# Patient Record
Sex: Female | Born: 1965 | Race: Black or African American | Hispanic: No | Marital: Married | Smoking: Never smoker
Health system: Southern US, Community
[De-identification: ages and names within clinical notes are randomized; demographics above are authoritative.]

## PROBLEM LIST (undated history)

## (undated) DIAGNOSIS — Z789 Other specified health status: Secondary | ICD-10-CM

## (undated) DIAGNOSIS — M1990 Unspecified osteoarthritis, unspecified site: Secondary | ICD-10-CM

## (undated) HISTORY — PX: SHOULDER ARTHROSCOPY: SHX128

## (undated) HISTORY — PX: DILATION AND CURETTAGE OF UTERUS: SHX78

---

## 2008-12-27 ENCOUNTER — Inpatient Hospital Stay (HOSPITAL_COMMUNITY): Admission: RE | Admit: 2008-12-27 | Discharge: 2008-12-28 | Payer: Self-pay | Admitting: Orthopedic Surgery

## 2010-01-21 IMAGING — CR DG CHEST 2V
2 series · 2 of 2 positions shown · non-contrast
Comparison: None available.

CLINICAL DATA: Preadmission respiratory film in patient for right
shoulder surgery.

CHEST - 2 VIEW

[view not recorded (1 of 2)]
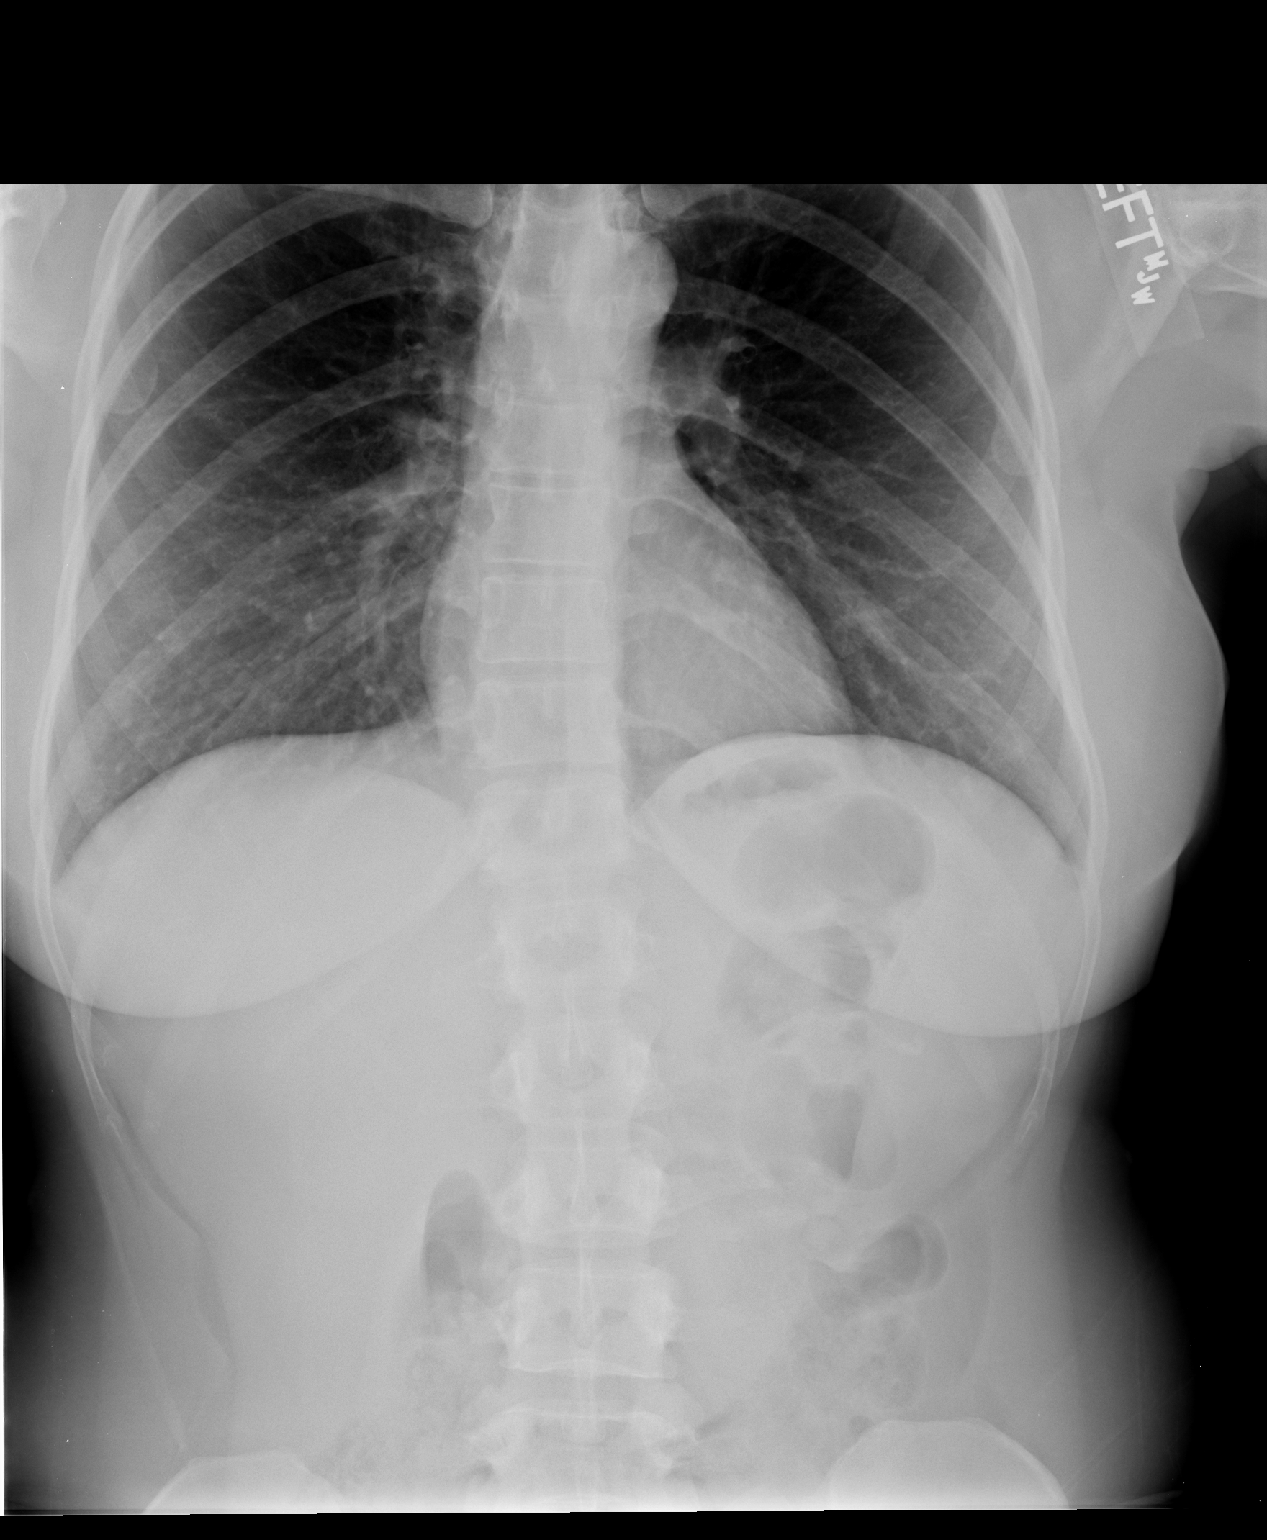

[view not recorded (2 of 2)]
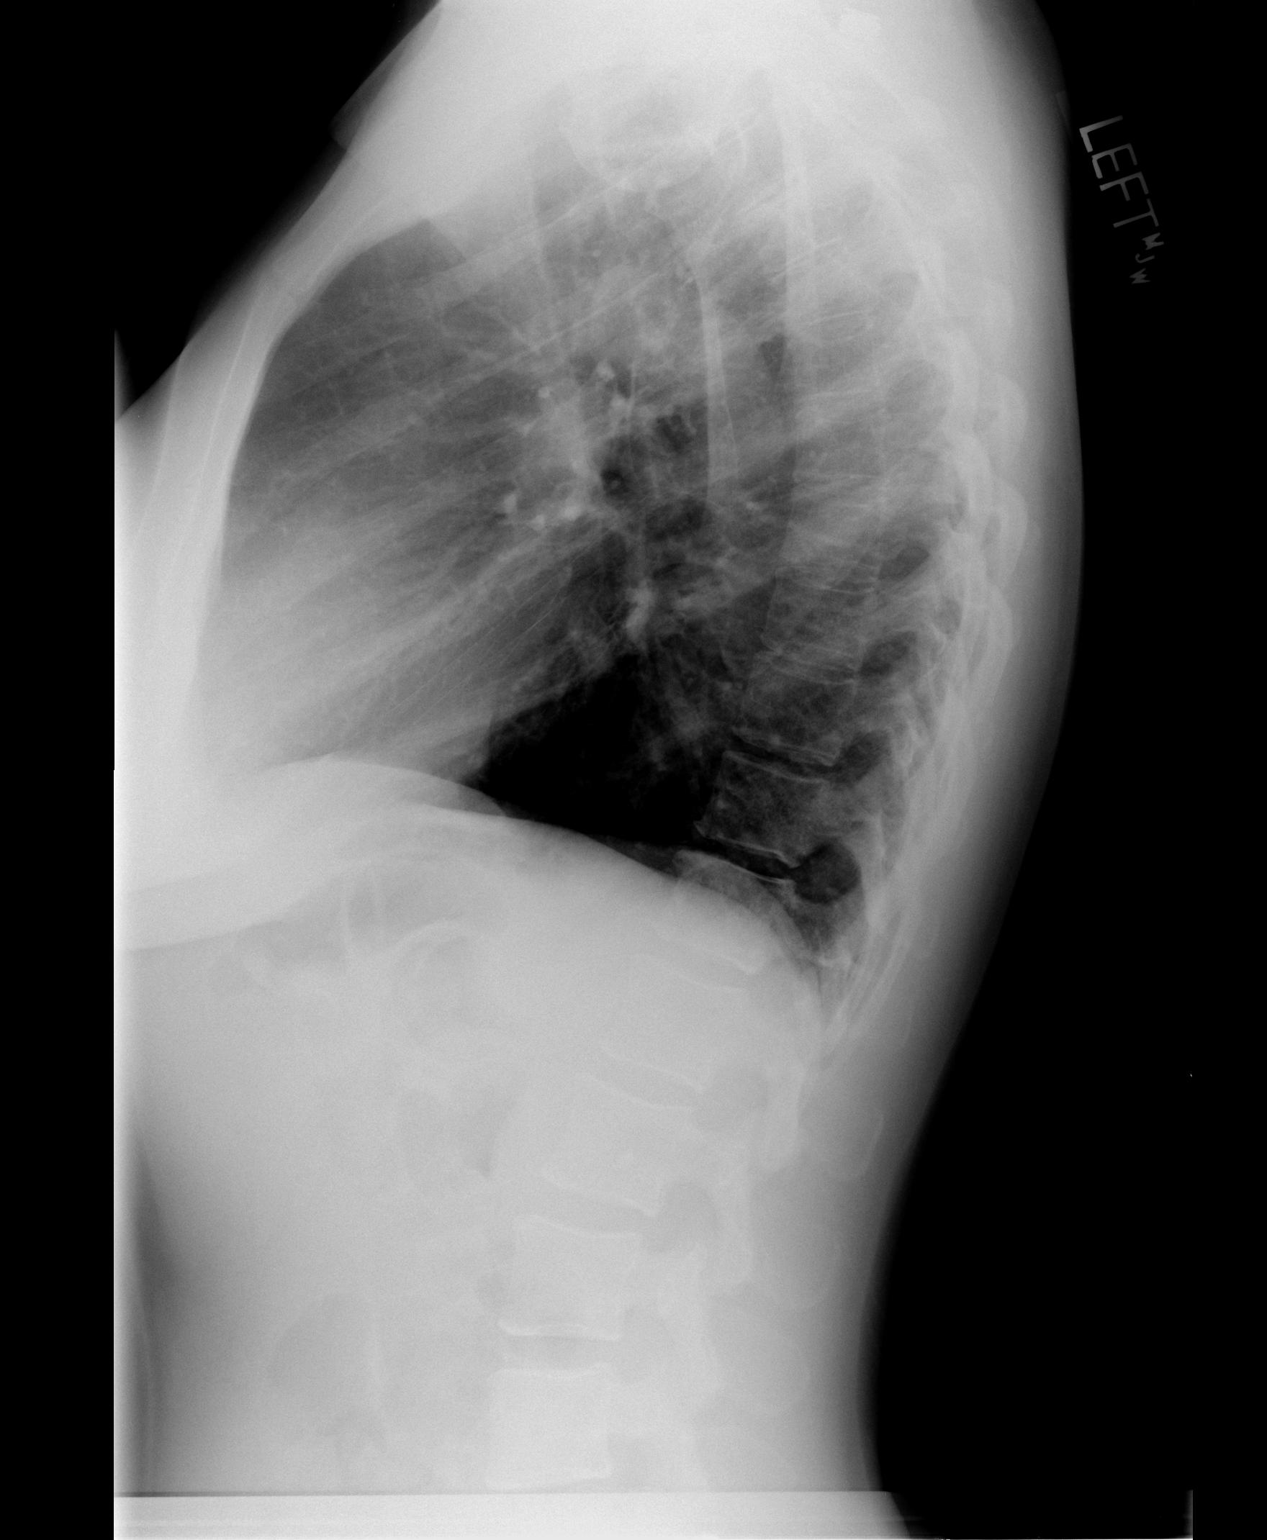

[2 of 2 positions shown; findings below may reference images not displayed]

FINDINGS: Lungs are clear.  No pleural effusion.  Heart size
normal.  No focal bony abnormality.
IMPRESSION: No acute disease.

## 2011-03-26 LAB — BASIC METABOLIC PANEL
BUN: 7 mg/dL (ref 6–23)
CO2: 23 mEq/L (ref 19–32)
Calcium: 9.4 mg/dL (ref 8.4–10.5)
Creatinine, Ser: 0.63 mg/dL (ref 0.4–1.2)
GFR calc Af Amer: >60 mL/min (ref >60)
GFR calc non Af Amer: >60 mL/min (ref >60)
Glucose, Bld: 87 mg/dL (ref 70–99)
Glucose, Bld: 112 mg/dL — ABNORMAL HIGH (ref 70–99)
Potassium: 3.2 mEq/L — ABNORMAL LOW (ref 3.5–5.1)
Sodium: 139 mEq/L (ref 135–145)

## 2011-03-26 LAB — APTT: aPTT: 27 seconds (ref 24–37)

## 2011-03-26 LAB — CBC
HCT: 30.4 % — ABNORMAL LOW (ref 36.0–46.0)
Hemoglobin: 10.5 g/dL — ABNORMAL LOW (ref 12.0–15.0)
MCHC: 33.4 g/dL (ref 30.0–36.0)
RBC: 3.33 MIL/uL — ABNORMAL LOW (ref 3.87–5.11)
RDW: 13.6 % (ref 11.5–15.5)
RDW: 14.1 % (ref 11.5–15.5)
WBC: 14.9 10*3/uL — ABNORMAL HIGH (ref 4.0–10.5)

## 2011-03-26 LAB — URINALYSIS, ROUTINE W REFLEX MICROSCOPIC
Glucose, UA: NEGATIVE mg/dL
Ketones, ur: NEGATIVE mg/dL
Specific Gravity, Urine: 1.014 (ref 1.005–1.030)
pH: 7 (ref 5.0–8.0)

## 2011-03-26 LAB — DIFFERENTIAL
Basophils Absolute: 0.1 10*3/uL (ref 0.0–0.1)
Basophils Relative: 1 % (ref 0–1)
Monocytes Absolute: 0.6 10*3/uL (ref 0.1–1.0)
Neutro Abs: 6 10*3/uL (ref 1.7–7.7)
Neutrophils Relative %: 70 % (ref 43–77)

## 2011-03-26 LAB — PROTIME-INR
INR: 1.1 (ref 0.00–1.49)
INR: 1.1 (ref 0.00–1.49)

## 2011-04-24 NOTE — Op Note (Signed)
NAMEKENISHA, LYNDS                 ACCOUNT NO.:  [id number]   MEDICAL RECORD NO.:  [id number]          PATIENT TYPE:  INP   LOCATION:  5041                         FACILITY:  [facility]   PHYSICIAN:  Feliberto Gottron. Turner Daniels, M.D.   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   PROCEDURE:  Right shoulder hemiarthroplasty using DePuy total shoulder  components an #8 body and a 48, +18 head.   SURGEON:  Feliberto Gottron. Turner Daniels, MD   FIRST ASSISTANT:  Shirl Harris, PA-C   ANESTHESIA:  Interscalene block plus endotracheal.   ESTIMATED BLOOD LOSS:  300 mL.   FLUID REPLACEMENT:  1500 mL of crystalloid.   DRAINS PLACED:  Foley catheter.   URINE OUTPUT:  300 mL.   INDICATIONS FOR PROCEDURE:  This is a 45 year old woman who I have  followed for last couple of years with avascular necrosis of the right  humeral head with collapse on the first x-ray when I met her.  She has  failed conservative treatment with anti-inflammatory medicines,  judicious use of narcotics, attempts at some physical therapy, but she  has a structural lesion of the humeral head with collapse and because of  the severe disabling pain desires elective hemiarthroplasty.  Risks and  benefits of surgery discussed, questions answered.   DESCRIPTION OF PROCEDURE:  The patient identified by armband and  underwent right interscalene block anesthetic at [facility] [address] in  the block area.  She received preoperative IV antibiotics and was taken  to operating room #15 where the appropriate anesthetic monitors were  attached and general endotracheal anesthesia induced with the patient in  supine position.  She was then  placed in a 30-degree beach chair  position and the right upper extremity prepped and draped in the usual  sterile fashion from the wrist to the hemithorax.  A standard time-out  procedure was then performed.  The skin along the deltopectoral interval  was then infiltrated with 20 mL of 0.5% Marcaine and epinephrine  solution and we began the actual procedure by making an incision  starting at the level of clavicle just above the coracoid process and  then following the deltopectoral interval for a distance of about 14 cm.  Small bleeders in the skin and subcutaneous tissue identified and  cauterized.  We identified the cephalic vein and retracted it laterally  with the deltoid muscle and exploited the deltopectoral fascia.  We  identified the coracoid process, identified the long head of the biceps,  and a shoulder retractor was placed between the long head of the biceps  in the deltoid exposing the insertion of the subscapularis muscle as  well as the greater tuberosity.  Using electrocautery, we then peeled  the subscapularis muscle off the lesser tuberosity and tagged with  two  #2 FiberWire sutures using a Mason-Allen stitch and exposed the humeral  head.  We then entered the shaft of the humerus about 5 or 6 mm medial  to the biceps groove with a small drill and then reamed up to a #8  reamer to the appropriate depth with good fit and fill.  The humeral  head cutting guide was then placed over the reamer with a standard 30  degrees of retroversion, and we resected the humeral head which had a  large divot in it from the AVN and measured for a 44 x 18 humeral head.  We then set about removing osteophytes from around the humeral head and  used a Crego retractor and a small Hohmann retractor to enhance our  exposure.  The body cutting osteotome was then applied and a trial #6  stem was inserted, and we performed trials with a 15 and the 18 x 48.  The 18 x 48 had the best fit and fill.   The shoulder would not go out,  and internal rotation and external rotation of about 45-60 degrees was  required before the implant was perched at all.  At this point the trial  components removed.  We evaluated the glenoid which was in good  condition, and we then inserted an #8 body which hammered into place  nicely and used bone graft from the osteophyte resection to supplement  the fixation.  We then hammered into place an 18 x 48 head after first  placing our sutures through the metaphysis inferiorly and anteriorly of  the humeral shaft for fixation of the subscapularis muscle which was  then sewed back down on its insertion using the sutures passed through  the bone #2 FiberWire.  The rotator interval was also closed with #2  FiberWire.  The shoulder was then irrigated out with normal saline  solution, taken through a range of motion, confirming good firm  fixation, and then the subcutaneous tissue closed with running 2-0  Vicryl and the skin with running interlocking 3-0 nylon.  A dressing of  Xeroform, 4 x 4s, ABD, and a simple sling was then applied.  The patient  was laid supine, awakened, and taken to the recovery room without  difficulty.      Feliberto Gottron. Turner Daniels, M.D.  Electronically Signed     FJR/MEDQ  D:  12/27/2008  T:  12/27/2008  Job:  [id number]

## 2012-01-10 ENCOUNTER — Ambulatory Visit: Payer: 59

## 2012-01-10 ENCOUNTER — Ambulatory Visit (INDEPENDENT_AMBULATORY_CARE_PROVIDER_SITE_OTHER): Payer: 59 | Admitting: Family Medicine

## 2012-01-10 VITALS — BP 122/80 | HR 72 | Temp 98.3°F | Resp 18 | Ht 65.5 in | Wt 153.0 lb

## 2012-01-10 DIAGNOSIS — J40 Bronchitis, not specified as acute or chronic: Secondary | ICD-10-CM

## 2012-01-10 DIAGNOSIS — R05 Cough: Secondary | ICD-10-CM

## 2012-01-10 DIAGNOSIS — M79646 Pain in unspecified finger(s): Secondary | ICD-10-CM

## 2012-01-10 DIAGNOSIS — M79609 Pain in unspecified limb: Secondary | ICD-10-CM

## 2012-01-10 DIAGNOSIS — R059 Cough, unspecified: Secondary | ICD-10-CM

## 2012-01-10 MED ORDER — AZITHROMYCIN 250 MG PO TABS: ORAL_TABLET | ORAL | Status: AC

## 2012-01-10 MED ORDER — PREDNISONE 20 MG PO TABS: 40.0000 mg | ORAL_TABLET | Freq: Every day | ORAL | Status: AC

## 2012-01-10 MED ORDER — HYDROCODONE-HOMATROPINE 5-1.5 MG/5ML PO SYRP: 5.0000 mL | ORAL_SOLUTION | Freq: Three times a day (TID) | ORAL | Status: AC | PRN

## 2012-01-10 NOTE — Progress Notes (Signed)
  Subjective:    Patient ID: Evelyn Juarez, female    DOB: 1966/11/26, 46 y.o.   MRN: [id number]  HPI 46 yo female with 2 complaints:  Cough:  Comes and goes, hoarse as well.  Dry cough.  Scratchy throat.  5 days.  No sinus symptoms.  EArs full.  No fever.  Received flu shot.  Non smoker.  NO history of asthma.  Tried alkaselter, helped some.  Cough is worse at night.   Hand pain:  Smashed 3rd finger on right in car door.  Tip of finer.  Swollen.  TTP.     Review of Systems Negative except as per hpi    Objective:   Physical Exam  Constitutional: She appears well-developed. No distress.  HENT:  Right Ear: Tympanic membrane, external ear and ear canal normal. Tympanic membrane is not injected, not scarred, not perforated, not erythematous, not retracted and not bulging.  Left Ear: Tympanic membrane, external ear and ear canal normal. Tympanic membrane is not injected, not scarred, not perforated, not erythematous, not retracted and not bulging.  Nose: No mucosal edema or rhinorrhea. Right sinus exhibits no maxillary sinus tenderness and no frontal sinus tenderness. Left sinus exhibits no maxillary sinus tenderness and no frontal sinus tenderness.  Mouth/Throat: Uvula is midline, oropharynx is clear and moist and mucous membranes are normal. No oropharyngeal exudate or tonsillar abscesses.  Cardiovascular: Normal rate, regular rhythm, normal heart sounds and intact distal pulses.   No murmur heard. Pulmonary/Chest: Effort normal and breath sounds normal. No respiratory distress. She has no wheezes. She has no rales.  Lymphadenopathy:       Head (right side): No submandibular and no preauricular adenopathy present.       Head (left side): No submandibular and no preauricular adenopathy present.       Right cervical: No superficial cervical and no posterior cervical adenopathy present.      Left cervical: No superficial cervical and no posterior cervical adenopathy present.       Right: No  supraclavicular adenopathy present.       Left: No supraclavicular adenopathy present.  Skin: Skin is warm and dry.  Right middle finger TTP DIP.  Decreased flexion.  Swollen.  Hematoma just proximal to nail bed, not under nail.    [facility] reading (PRIMARY) by  Dr. Georgiana Shore:  No obvious fracture of 3rd distal phalanx or DIP.      Assessment & Plan:

## 2012-01-10 NOTE — Patient Instructions (Signed)
Thank you for coming in today.  I appreciate your patience as we become more comfortable with our computer system.  Today you saw Ardeen Garland, MD. I hope you feel better quickly. Please review the information below regarding your diagnosis(es) at your leisure.      Bronchitis Bronchitis is the body's way of reacting to injury and/or infection (inflammation) of the bronchi. Bronchi are the air tubes that extend from the windpipe into the lungs. If the inflammation becomes severe, it may cause shortness of breath. CAUSES  Inflammation may be caused by:  A virus.   Germs (bacteria).   Dust.   Allergens.   Pollutants and many other irritants.  The cells lining the bronchial tree are covered with tiny hairs (cilia). These constantly beat upward, away from the lungs, toward the mouth. This keeps the lungs free of pollutants. When these cells become too irritated and are unable to do their job, mucus begins to develop. This causes the characteristic cough of bronchitis. The cough clears the lungs when the cilia are unable to do their job. Without either of these protective mechanisms, the mucus would settle in the lungs. Then you would develop pneumonia. Smoking is a common cause of bronchitis and can contribute to pneumonia. Stopping this habit is the single most important thing you can do to help yourself. TREATMENT   Your caregiver may prescribe an antibiotic if the cough is caused by bacteria. Also, medicines that open up your airways make it easier to breathe. Your caregiver may also recommend or prescribe an expectorant. It will loosen the mucus to be coughed up. Only take over-the-counter or prescription medicines for pain, discomfort, or fever as directed by your caregiver.   Removing whatever causes the problem (smoking, for example) is critical to preventing the problem from getting worse.   Cough suppressants may be prescribed for relief of cough symptoms.   Inhaled medicines may  be prescribed to help with symptoms now and to help prevent problems from returning.   For those with recurrent (chronic) bronchitis, there may be a need for steroid medicines.  SEEK IMMEDIATE MEDICAL CARE IF:   During treatment, you develop more pus-like mucus (purulent sputum).   You have a fever.   Your baby is older than 3 months with a rectal temperature of 102 F (38.9 C) or higher.   Your baby is 22 months old or younger with a rectal temperature of 100.4 F (38 C) or higher.   You become progressively more ill.   You have increased difficulty breathing, wheezing, or shortness of breath.  It is necessary to seek immediate medical care if you are elderly or sick from any other disease. MAKE SURE YOU:   Understand these instructions.   Will watch your condition.   Will get help right away if you are not doing well or get worse.  Document Released: 11/26/2005 Document Revised: 08/08/2011 Document Reviewed: 10/05/2008 [facility] Patient Information 2012 [place], [place].

## 2013-12-07 ENCOUNTER — Ambulatory Visit (INDEPENDENT_AMBULATORY_CARE_PROVIDER_SITE_OTHER): Payer: 59 | Admitting: Internal Medicine

## 2013-12-07 VITALS — BP 110/72 | HR 82 | Temp 98.5°F | Resp 18 | Ht 65.5 in | Wt 148.0 lb

## 2013-12-07 DIAGNOSIS — R05 Cough: Secondary | ICD-10-CM

## 2013-12-07 DIAGNOSIS — J019 Acute sinusitis, unspecified: Secondary | ICD-10-CM

## 2013-12-07 DIAGNOSIS — J029 Acute pharyngitis, unspecified: Secondary | ICD-10-CM

## 2013-12-07 MED ORDER — HYDROCODONE-HOMATROPINE 5-1.5 MG/5ML PO SYRP: 5.0000 mL | ORAL_SOLUTION | Freq: Four times a day (QID) | ORAL | Status: DC | PRN

## 2013-12-07 MED ORDER — AMOXICILLIN 875 MG PO TABS: 875.0000 mg | ORAL_TABLET | Freq: Two times a day (BID) | ORAL | Status: DC

## 2013-12-07 NOTE — Progress Notes (Signed)
  This chart was scribed for Ellamae Sia, MD by Joaquin Music, ED Scribe. This patient was seen in room Room/bed 13 and the patient's care was started at 1:23 PM. Subjective:    Patient ID: Evelyn Juarez, female    DOB: Jan 03, 1966, 47 y.o.   MRN: [id number] Chief Complaint  Patient presents with  . Nasal Congestion    x3 days   . Cough  . Headache  . Otalgia   HPI Evelyn Juarez is a 47 y.o. female who presents to the [facility] complaining of ongoing cough, nasal congestion, and HA that began 5 days ago. Pt states her cough has been productive with green/yellow sputum. She states when her symptoms initially started, she was having a sore throat but denies having a sore throat at this moment. Pt denies fever and diaphoresis.   History   Social History  . Marital Status: Married    Spouse Name: N/A    Number of Children: N/A  . Years of Education: N/A   Social History Main Topics  . Smoking status: Never Smoker   . Smokeless tobacco: None  . Alcohol Use: None  . Drug Use: None  . Sexual Activity: None   Other Topics Concern  . None   Social History Narrative  . None   History reviewed. No pertinent past surgical history.  Family History  Problem Relation Age of Onset  . Diabetes Mother    No current outpatient prescriptions on file.   Review of Systems A complete 10 system review of systems was obtained and all systems are negative except as noted in the HPI and PMH.   Objective:   Physical Exam  Constitutional: She appears well-developed and well-nourished. No distress.  HENT:  Mouth/Throat: Oropharynx is clear and moist.  Purulent discharge from both nares.  Eyes: Conjunctivae and EOM are normal. Pupils are equal, round, and reactive to light.  Pulmonary/Chest: Effort normal and breath sounds normal. She has no wheezes.  Lymphadenopathy:    She has no cervical adenopathy.  Psychiatric: She has a normal mood and affect. Her behavior is normal. Thought  content normal.    BP 110/72  Pulse 82  Temp(Src) 98.5 F (36.9 C) (Oral)  Resp 18  Ht 5' 5.5" (1.664 m)  Wt 148 lb (67.132 kg)  BMI 24.25 kg/m2  SpO2 100%  LMP 11/28/2013 Assessment & Plan:   1. Acute sinusitis, unspecified   2. Acute pharyngitis   3. Cough    Meds ordered this encounter  Medications  . HYDROcodone-homatropine (HYCODAN) 5-1.5 MG/5ML syrup    Sig: Take 5 mLs by mouth every 6 (six) hours as needed for cough.    Dispense:  120 mL    Refill:  0  . amoxicillin (AMOXIL) 875 MG tablet    Sig: Take 1 tablet (875 mg total) by mouth 2 (two) times daily.    Dispense:  20 tablet    Refill:  0     I personally performed the services described in this documentation, which was scribed in my presence. The recorded information has been reviewed and is accurate.

## 2014-08-12 ENCOUNTER — Encounter (HOSPITAL_COMMUNITY): Payer: Self-pay | Admitting: *Deleted

## 2014-08-13 ENCOUNTER — Encounter (HOSPITAL_COMMUNITY): Payer: Self-pay | Admitting: Pharmacist

## 2014-08-26 NOTE — H&P (Signed)
Evelyn Juarez is an 48 y.o. female with heavy menses. U/S in office C/W fibroids and 12 mm IU mass.  Pertinent Gynecological History: Menses: flow is excessive with use of many pads or tampons on heaviest days Bleeding: dysfunctional uterine bleeding Contraception: tubal ligation DES exposure: denies Blood transfusions: none Sexually transmitted diseases: no past history Previous GYN Procedures: none  Last mammogram: normal Date: 2015 Last pap: normal Date: 2015 OB History: G3, P1   Menstrual History: Menarche age: unknown  Patient's last menstrual period was 08/08/2014.    History reviewed. No pertinent past medical history.  Past Surgical History  Procedure Laterality Date  . Shoulder arthroscopy      Family History  Problem Relation Age of Onset  . Diabetes Mother     Social History:  reports that she has never smoked. She does not have any smokeless tobacco history on file. She reports that she drinks alcohol. She reports that she does not use illicit drugs.  Allergies: No Known Allergies  No prescriptions prior to admission    Review of Systems  Constitutional: Negative for fever.    Last menstrual period 08/08/2014. Physical Exam  Cardiovascular: Normal rate and regular rhythm.   Respiratory: Effort normal and breath sounds normal.  GI: There is no tenderness.    No results found for this or any previous visit (from the past 24 hour(s)).  No results found.  Assessment/Plan: 48 yo with menorrhagia and IU mass D/W patient H/S, D&C, Trueclear resectoscope D/W risks including infection, organ damage, bleeding/transfusion-HIV/Hep, uterine perforation, DVT/PE, pneumonia, laparotomy, L/S, return to OR, persistent or recurrent abnormal bleeding  Jadence Kinlaw II,Dravin Lance E 08/26/2014, 6:50 PM

## 2014-08-27 ENCOUNTER — Encounter (HOSPITAL_COMMUNITY)
Admission: RE | Disposition: A | Discharge status: Home / Self Care | Payer: Self-pay | Source: Ambulatory Visit | Attending: Obstetrics and Gynecology

## 2014-08-27 ENCOUNTER — Ambulatory Visit (HOSPITAL_COMMUNITY): Payer: 59 | Admitting: Anesthesiology

## 2014-08-27 ENCOUNTER — Encounter (HOSPITAL_COMMUNITY): Payer: Self-pay | Admitting: *Deleted

## 2014-08-27 ENCOUNTER — Encounter (HOSPITAL_COMMUNITY): Payer: 59 | Admitting: Anesthesiology

## 2014-08-27 ENCOUNTER — Ambulatory Visit (HOSPITAL_COMMUNITY)
Admission: RE | Admit: 2014-08-27 | Discharge: 2014-08-27 | Disposition: A | Discharge status: Home / Self Care | Payer: 59 | Source: Ambulatory Visit | Attending: Obstetrics and Gynecology | Admitting: Obstetrics and Gynecology

## 2014-08-27 DIAGNOSIS — N920 Excessive and frequent menstruation with regular cycle: Secondary | ICD-10-CM | POA: Insufficient documentation

## 2014-08-27 DIAGNOSIS — N841 Polyp of cervix uteri: Secondary | ICD-10-CM | POA: Diagnosis not present

## 2014-08-27 HISTORY — DX: Other specified health status: Z78.9

## 2014-08-27 HISTORY — PX: DILATATION & CURETTAGE/HYSTEROSCOPY WITH TRUECLEAR: SHX6353

## 2014-08-27 LAB — CBC
HCT: 31.8 % — ABNORMAL LOW (ref 36.0–46.0)
Hemoglobin: 11.7 g/dL — ABNORMAL LOW (ref 12.0–15.0)
MCH: 31 pg (ref 26.0–34.0)
MCHC: 36.8 g/dL — AB (ref 30.0–36.0)
MCV: 84.1 fL (ref 78.0–100.0)
Platelets: 242 10*3/uL (ref 150–400)
RBC: 3.78 MIL/uL — ABNORMAL LOW (ref 3.87–5.11)
RDW: 15.4 % (ref 11.5–15.5)
WBC: 8.7 10*3/uL (ref 4.0–10.5)

## 2014-08-27 SURGERY — DILATATION & CURETTAGE/HYSTEROSCOPY WITH TRUCLEAR
Anesthesia: General | Site: Vagina

## 2014-08-27 MED ORDER — LIDOCAINE HCL 1 % IJ SOLN
INTRAMUSCULAR | Status: DC | PRN
  Administered 2014-08-27: 20 mL

## 2014-08-27 MED ORDER — MIDAZOLAM HCL 2 MG/2ML IJ SOLN
INTRAMUSCULAR | Status: DC | PRN
  Administered 2014-08-27: 2 mg via INTRAVENOUS

## 2014-08-27 MED ORDER — FENTANYL CITRATE 0.05 MG/ML IJ SOLN
INTRAMUSCULAR | Status: AC
  Filled 2014-08-27: qty 5

## 2014-08-27 MED ORDER — ONDANSETRON HCL 4 MG/2ML IJ SOLN
INTRAMUSCULAR | Status: DC | PRN
  Administered 2014-08-27: 4 mg via INTRAVENOUS

## 2014-08-27 MED ORDER — PROPOFOL INFUSION 10 MG/ML OPTIME
INTRAVENOUS | Status: DC | PRN
Start: 2014-08-27 — End: 2014-08-27
  Administered 2014-08-27: 20 mL via INTRAVENOUS

## 2014-08-27 MED ORDER — FENTANYL CITRATE 0.05 MG/ML IJ SOLN
INTRAMUSCULAR | Status: AC
  Administered 2014-08-27: 25 ug via INTRAVENOUS
  Filled 2014-08-27: qty 2

## 2014-08-27 MED ORDER — CEFAZOLIN SODIUM-DEXTROSE 2-3 GM-% IV SOLR: 2.0000 g | INTRAVENOUS | Status: DC

## 2014-08-27 MED ORDER — FENTANYL CITRATE 0.05 MG/ML IJ SOLN
INTRAMUSCULAR | Status: DC | PRN
  Administered 2014-08-27: 100 ug via INTRAVENOUS
  Administered 2014-08-27: 50 ug via INTRAVENOUS

## 2014-08-27 MED ORDER — ONDANSETRON HCL 4 MG/2ML IJ SOLN
INTRAMUSCULAR | Status: AC
  Filled 2014-08-27: qty 2

## 2014-08-27 MED ORDER — LACTATED RINGERS IV SOLN
INTRAVENOUS | Status: DC
  Administered 2014-08-27 (×2): via INTRAVENOUS

## 2014-08-27 MED ORDER — SCOPOLAMINE 1 MG/3DAYS TD PT72
1.0000 | MEDICATED_PATCH | Freq: Once | TRANSDERMAL | Status: DC
  Administered 2014-08-27: 1.5 mg via TRANSDERMAL

## 2014-08-27 MED ORDER — DEXAMETHASONE SODIUM PHOSPHATE 10 MG/ML IJ SOLN
INTRAMUSCULAR | Status: AC
  Filled 2014-08-27: qty 1

## 2014-08-27 MED ORDER — LIDOCAINE HCL (CARDIAC) 20 MG/ML IV SOLN
INTRAVENOUS | Status: AC
  Filled 2014-08-27: qty 5

## 2014-08-27 MED ORDER — LIDOCAINE HCL (CARDIAC) 20 MG/ML IV SOLN
INTRAVENOUS | Status: DC | PRN
  Administered 2014-08-27: 60 mg via INTRAVENOUS

## 2014-08-27 MED ORDER — FENTANYL CITRATE 0.05 MG/ML IJ SOLN
25.0000 ug | INTRAMUSCULAR | Status: DC | PRN
  Administered 2014-08-27: 50 ug via INTRAVENOUS
  Administered 2014-08-27: 25 ug via INTRAVENOUS

## 2014-08-27 MED ORDER — SCOPOLAMINE 1 MG/3DAYS TD PT72
MEDICATED_PATCH | TRANSDERMAL | Status: AC
  Administered 2014-08-27: 1.5 mg via TRANSDERMAL
  Filled 2014-08-27: qty 1

## 2014-08-27 MED ORDER — CEFAZOLIN SODIUM-DEXTROSE 2-3 GM-% IV SOLR
INTRAVENOUS | Status: AC
  Administered 2014-08-27: 2 g via INTRAVENOUS
  Filled 2014-08-27: qty 50

## 2014-08-27 MED ORDER — LIDOCAINE HCL 1 % IJ SOLN
INTRAMUSCULAR | Status: AC
  Filled 2014-08-27: qty 20

## 2014-08-27 MED ORDER — DEXAMETHASONE SODIUM PHOSPHATE 4 MG/ML IJ SOLN
INTRAMUSCULAR | Status: DC | PRN
  Administered 2014-08-27: 4 mg via INTRAVENOUS

## 2014-08-27 MED ORDER — METOCLOPRAMIDE HCL 5 MG/ML IJ SOLN: 10.0000 mg | Freq: Once | INTRAMUSCULAR | Status: DC | PRN

## 2014-08-27 MED ORDER — MIDAZOLAM HCL 2 MG/2ML IJ SOLN
INTRAMUSCULAR | Status: AC
  Filled 2014-08-27: qty 2

## 2014-08-27 MED ORDER — GLYCOPYRROLATE 0.2 MG/ML IJ SOLN
INTRAMUSCULAR | Status: DC | PRN
  Administered 2014-08-27: 0.1 mg via INTRAVENOUS

## 2014-08-27 MED ORDER — PROPOFOL 10 MG/ML IV EMUL
INTRAVENOUS | Status: AC
  Filled 2014-08-27: qty 20

## 2014-08-27 MED ORDER — HYDROCODONE-ACETAMINOPHEN 5-325 MG PO TABS: 1.0000 | ORAL_TABLET | Freq: Four times a day (QID) | ORAL | Status: DC | PRN

## 2014-08-27 MED ORDER — MEPERIDINE HCL 25 MG/ML IJ SOLN: 6.2500 mg | INTRAMUSCULAR | Status: DC | PRN

## 2014-08-27 MED ORDER — SODIUM CHLORIDE 0.9 % IR SOLN
Status: DC | PRN
  Administered 2014-08-27: 1

## 2014-08-27 SURGICAL SUPPLY — 17 items
BLADE INCISOR TRUC PLUS 2.9 (ABLATOR) ×1 IMPLANT
BOOTIES KNEE HIGH SLOAN (MISCELLANEOUS) ×3 IMPLANT
CANISTERS HI-FLOW 3000CC (CANNISTER) ×6 IMPLANT
CATH ROBINSON RED A/P 16FR (CATHETERS) ×3 IMPLANT
CLOTH BEACON ORANGE TIMEOUT ST (SAFETY) ×3 IMPLANT
CONTAINER PREFILL 10% NBF 60ML (FORM) ×6 IMPLANT
DRAPE HYSTEROSCOPY (DRAPE) ×3 IMPLANT
GLOVE BIO SURGEON STRL SZ7.5 (GLOVE) ×6 IMPLANT
GLOVE BIOGEL PI IND STRL 8 (GLOVE) ×2 IMPLANT
GLOVE BIOGEL PI INDICATOR 8 (GLOVE) ×4
GOWN STRL REUS W/TWL LRG LVL3 (GOWN DISPOSABLE) ×6 IMPLANT
INCISOR TRUC PLUS BLADE 2.9 (ABLATOR) ×3
KIT HYSTEROSCOPY TRUCLEAR (ABLATOR) IMPLANT
MORCELLATOR RECIP TRUCLEAR 4.0 (ABLATOR) IMPLANT
PACK VAGINAL MINOR WOMEN LF (CUSTOM PROCEDURE TRAY) ×3 IMPLANT
PAD OB MATERNITY 4.3X12.25 (PERSONAL CARE ITEMS) ×3 IMPLANT
TOWEL OR 17X24 6PK STRL BLUE (TOWEL DISPOSABLE) ×6 IMPLANT

## 2014-08-27 NOTE — Discharge Instructions (Signed)

## 2014-08-27 NOTE — Progress Notes (Signed)
No changes to H&P per patient history reviewed with patient procedure-H/S, D&C, Trueclear resectoscope All questions answered

## 2014-08-27 NOTE — Anesthesia Postprocedure Evaluation (Signed)
  Anesthesia Post-op Note  Patient: Evelyn Juarez  Procedure(s) Performed: Procedure(s): DILATATION & CURETTAGE, HYSTEROSCOPY WITH TRUCLEAR (N/A)  Patient Location: PACU  Anesthesia Type:General  Level of Consciousness: awake, alert  and oriented  Airway and Oxygen Therapy: Patient Spontanous Breathing  Post-op Pain: none  Post-op Assessment: Post-op Vital signs reviewed, Patient's Cardiovascular Status Stable, Respiratory Function Stable, Patent Airway, No signs of Nausea or vomiting and Pain level controlled  Post-op Vital Signs: Reviewed and stable  Last Vitals:  Filed Vitals:   08/27/14 0915  BP: 106/68  Pulse: 59  Temp: 36.3 C  Resp: 21    Complications: No apparent anesthesia complications

## 2014-08-27 NOTE — Addendum Note (Signed)
Addendum created 08/27/14 1258 by Graciela Husbands, CRNA   Modules edited: Anesthesia Medication Administration

## 2014-08-27 NOTE — Anesthesia Procedure Notes (Signed)
Procedure Name: LMA Insertion Date/Time: 08/27/2014 7:38 AM Performed by: Graciela Husbands Pre-anesthesia Checklist: Patient identified, Timeout performed, Emergency Drugs available, Suction available and Patient being monitored Patient Re-evaluated:Patient Re-evaluated prior to inductionOxygen Delivery Method: Circle system utilized Preoxygenation: Pre-oxygenation with 100% oxygen Intubation Type: IV induction Ventilation: Mask ventilation without difficulty LMA: LMA inserted LMA Size: 4.0 Number of attempts: 1 Placement Confirmation: breath sounds checked- equal and bilateral and positive ETCO2 Tube secured with: Tape Dental Injury: Teeth and Oropharynx as per pre-operative assessment

## 2014-08-27 NOTE — Transfer of Care (Signed)
Immediate Anesthesia Transfer of Care Note  Patient: Evelyn Juarez  Procedure(s) Performed: Procedure(s): DILATATION & CURETTAGE, HYSTEROSCOPY WITH TRUCLEAR (N/A)  Patient Location: PACU  Anesthesia Type:General  Level of Consciousness: awake, alert  and oriented  Airway & Oxygen Therapy: Patient Spontanous Breathing and Patient connected to nasal cannula oxygen  Post-op Assessment: Report given to PACU RN and Post -op Vital signs reviewed and stable  Post vital signs: Reviewed and stable  Complications: No apparent anesthesia complications

## 2014-08-27 NOTE — Brief Op Note (Signed)
08/27/2014  8:02 AM  PATIENT:  Evelyn Juarez  48 y.o. female  PRE-OPERATIVE DIAGNOSIS:  Endometrial mass  POST-OPERATIVE DIAGNOSIS:  Endometrial mass  PROCEDURE:  Procedure(s): DILATATION & CURETTAGE, HYSTEROSCOPY WITH TRUCLEAR (N/A)  SURGEON:  Surgeon(s) and Role:    * Leslie Andrea, MD - Primary  PHYSICIAN ASSISTANT:   ASSISTANTS: none   ANESTHESIA:   general and paracervical block  EBL:  Total I/O In: 1000 [I.V.:1000] Out: 50 [Urine:50]  BLOOD ADMINISTERED:none  DRAINS: none   LOCAL MEDICATIONS USED:  LIDOCAINE  and Amount: 20 ml  SPECIMEN:  Source of Specimen:  endometrial resection, endometrial currettings  DISPOSITION OF SPECIMEN:  PATHOLOGY  COUNTS:  YES  TOURNIQUET:  * No tourniquets in log *  DICTATION: .Other Dictation: Dictation Number [id number]  PLAN OF CARE: Discharge to home after PACU  PATIENT DISPOSITION:  PACU - hemodynamically stable.   Delay start of Pharmacological VTE agent (>24hrs) due to surgical blood loss or risk of bleeding: not applicable

## 2014-08-27 NOTE — Anesthesia Preprocedure Evaluation (Signed)
Anesthesia Evaluation  Patient identified by MRN, date of birth, ID band Patient awake    Reviewed: Allergy & Precautions, H&P , NPO status , Patient's Chart, lab work & pertinent test results  Airway Mallampati: II TM Distance: >3 FB Neck ROM: Full    Dental no notable dental hx. (+) Teeth Intact   Pulmonary neg pulmonary ROS,  breath sounds clear to auscultation  Pulmonary exam normal       Cardiovascular negative cardio ROS  Rhythm:Regular Rate:Normal     Neuro/Psych negative neurological ROS  negative psych ROS   GI/Hepatic negative GI ROS, Neg liver ROS,   Endo/Other  negative endocrine ROS  Renal/GU negative Renal ROS  negative genitourinary   Musculoskeletal negative musculoskeletal ROS (+)   Abdominal   Peds  Hematology negative hematology ROS (+)   Anesthesia Other Findings   Reproductive/Obstetrics Endometrial Mass                           Anesthesia Physical Anesthesia Plan  ASA: II  Anesthesia Plan: General   Post-op Pain Management:    Induction:   Airway Management Planned: LMA  Additional Equipment:   Intra-op Plan:   Post-operative Plan: Extubation in OR  Informed Consent: I have reviewed the patients History and Physical, chart, labs and discussed the procedure including the risks, benefits and alternatives for the proposed anesthesia with the patient or authorized representative who has indicated his/her understanding and acceptance.   Dental advisory given  Plan Discussed with: CRNA, Anesthesiologist and Surgeon  Anesthesia Plan Comments:         Anesthesia Quick Evaluation

## 2014-08-27 NOTE — Op Note (Signed)
NAMERAGINA, FENTER                 ACCOUNT NO.:  [id number]  MEDICAL RECORD NO.:  [id number]  LOCATION:  [facility]                          FACILITY:  [facility]  PHYSICIAN:  Guy Sandifer. Henderson Cloud, M.D. DATE OF BIRTH:  February 09, 1966  DATE OF PROCEDURE:  08/27/2014 DATE OF DISCHARGE:                              OPERATIVE REPORT   PREOPERATIVE DIAGNOSIS:  Menorrhagia.  POSTOPERATIVE DIAGNOSIS:  Menorrhagia.  PROCEDURE:  Hysteroscopy with Truclear resection of endometrial mass, dilation and curettage.  SURGEON:  Guy Sandifer. Henderson Cloud, M.D.  ANESTHESIA:  General with LMA.  SPECIMENS: 1. Endometrial curettings. 2. Endometrial resections, both to Pathology.  ESTIMATED BLOOD LOSS:  Less than 100 mL.  I'S AND O'S:  Distending media 70 mL deficit.  INDICATIONS AND CONSENT:  This patient is a 48 year old [place] female, with heavy menses.  Details are dictated in the history and physical.  Hysteroscopy, dilation and curettage, Truclear resectoscope was discussed preoperatively.  Potential risks and complications were reviewed preoperatively including, but not limited to, infection, uterine perforation, organ damage, bleeding requiring transfusion of blood products with HIV and hepatitis acquisition, DVT, PE, pneumonia, laparoscopy, laparotomy, return to the operating room, pelvic pain, abdominal pain, painful intercourse, and persistent or recurrent abnormal bleeding.  All questions were answered and consent is signed on the chart.  FINDINGS:  Both fallopian tube ostia identified.  There is a 12-mm pedunculated mass in the center of the anterior upper endometrial cavity.  There is an 8-mm endocervical polyp at the 10 o'clock position.  DESCRIPTION OF PROCEDURE:  The patient was taken to the operating room where she was identified, placed in dorsal supine position, and general anesthesia was induced via LMA.  She was then placed in a dorsal lithotomy position.  Time-out undertaken.  She was  prepped, bladder straight catheterized, and draped in a sterile fashion.  Bivalve speculum was placed in the vagina.  The anterior cervical lip was injected with 1% plain Xylocaine and grasped with a single-tooth tenaculum.  Paracervical block was placed at 2, 4, 5, 7, 8, and 10 o'clock positions with approximately 20 mL of the same solution.  Cervix was gently progressively dilated to a 19 dilator.  The Truclear hysteroscope was placed in the endocervical canal and advanced under direct visualization using distending media.  The above findings were noted.  Truclear resectoscope was then used to resect the endometrial mass down to the level of the surrounding myometrium.  The endocervical polyp was also resected.  Instrument was removed and sharp curettage was carried out.  Reinspection with the hysteroscope reveals the cavity is clean.  All instruments were removed.  Good hemostasis was noted.  All counts were correct and the patient was taken to the recovery room in stable condition.     Guy Sandifer Henderson Cloud, M.D.     JET/MEDQ  D:  08/27/2014  T:  08/27/2014  Job:  [id number]

## 2014-08-30 ENCOUNTER — Encounter (HOSPITAL_COMMUNITY): Payer: Self-pay | Admitting: Obstetrics and Gynecology

## 2015-07-04 ENCOUNTER — Other Ambulatory Visit: Payer: Self-pay | Admitting: Obstetrics and Gynecology

## 2015-07-05 LAB — CYTOLOGY - PAP

## 2016-12-10 DIAGNOSIS — M1990 Unspecified osteoarthritis, unspecified site: Secondary | ICD-10-CM

## 2016-12-10 HISTORY — DX: Unspecified osteoarthritis, unspecified site: M19.90

## 2017-11-08 NOTE — Pre-Procedure Instructions (Signed)
Evelyn Juarez  11/08/2017      [facility] - [place], [place] - [address] [place] [address] Phone: [phone] Fax: [phone]  [facility] - [place], [place] - [address] [address] [address] [place] [address] Phone: [phone] Fax: [phone]    Your procedure is scheduled on November 18, 2017.  [facility] at [id number] AM.  Call this number if you have problems the morning of surgery:  [phone]   Remember:  Do not eat food or drink liquids after midnight.  Take these medicines the morning of surgery with A SIP OF WATER (none).  7 days prior to surgery STOP taking any Aspirin (unless otherwise instructed by your surgeon), Aleve, Naproxen, Ibuprofen, Motrin, Advil, Goody's, BC's, all herbal medications, fish oil, and all vitamins  Continue all other medications as instructed by your physician except follow the above medication instructions before surgery   Do not wear jewelry, make-up or nail polish.  Do not wear lotions, powders, or perfumes, or deoderant.  Do not shave 48 hours prior to surgery.    Do not bring valuables to the hospital.  [facility] is not responsible for any belongings or valuables.  Contacts, dentures or bridgework may not be worn into surgery.  Leave your suitcase in the car.  After surgery it may be brought to your room.  For patients admitted to the hospital, discharge time will be determined by your treatment team.  Patients discharged the day of surgery will not be allowed to drive home.   Special instructions:   [place]- Preparing For Surgery  Before surgery, you can play an important role. Because skin is not sterile, your skin needs to be as free of germs as possible. You can reduce the number of germs on your skin by washing with CHG (chlorahexidine gluconate) Soap before surgery.  CHG is an antiseptic cleaner which  kills germs and bonds with the skin to continue killing germs even after washing.  Please do not use if you have an allergy to CHG or antibacterial soaps. If your skin becomes reddened/irritated stop using the CHG.  Do not shave (including legs and underarms) for at least 48 hours prior to first CHG shower. It is OK to shave your face.  Please follow these instructions carefully.   1. Shower the NIGHT BEFORE SURGERY and the MORNING OF SURGERY with CHG.   2. If you chose to wash your hair, wash your hair first as usual with your normal shampoo.  3. After you shampoo, rinse your hair and body thoroughly to remove the shampoo.  4. Use CHG as you would any other liquid soap. You can apply CHG directly to the skin and wash gently with a scrungie or a clean washcloth.   5. Apply the CHG Soap to your body ONLY FROM THE NECK DOWN.  Do not use on open wounds or open sores. Avoid contact with your eyes, ears, mouth and genitals (private parts). Wash Face and genitals (private parts)  with your normal soap.  6. Wash thoroughly, paying special attention to the area where your surgery will be performed.  7. Thoroughly rinse your body with warm water from the neck down.  8. DO NOT shower/wash with your normal soap after using and rinsing off the CHG Soap.  9. Pat yourself dry with a CLEAN TOWEL.  10. Wear CLEAN PAJAMAS to bed the night before surgery, wear comfortable clothes the morning of surgery  11. Place CLEAN Ryans on your bed the night of your first shower and DO NOT SLEEP WITH PETS.    Day of Surgery: Do not apply any deodorants/lotions. Please wear clean clothes to the hospital/surgery center.     Please read over the following fact [place] that you were given. Pain Booklet, Coughing and Deep Breathing, MRSA Information and Surgical Site Infection Prevention

## 2017-11-11 ENCOUNTER — Encounter (HOSPITAL_COMMUNITY)
Admission: RE | Admit: 2017-11-11 | Discharge: 2017-11-11 | Disposition: A | Discharge status: Home / Self Care | Payer: 59 | Source: Ambulatory Visit | Attending: Orthopedic Surgery | Admitting: Orthopedic Surgery

## 2017-11-11 ENCOUNTER — Encounter (HOSPITAL_COMMUNITY): Payer: Self-pay | Admitting: *Deleted

## 2017-11-11 ENCOUNTER — Ambulatory Visit (HOSPITAL_COMMUNITY)
Admission: RE | Admit: 2017-11-11 | Discharge: 2017-11-11 | Disposition: A | Discharge status: Home / Self Care | Payer: 59 | Source: Ambulatory Visit | Attending: Orthopedic Surgery | Admitting: Orthopedic Surgery

## 2017-11-11 DIAGNOSIS — Z01812 Encounter for preprocedural laboratory examination: Secondary | ICD-10-CM | POA: Insufficient documentation

## 2017-11-11 DIAGNOSIS — Z01818 Encounter for other preprocedural examination: Secondary | ICD-10-CM | POA: Insufficient documentation

## 2017-11-11 DIAGNOSIS — Z01810 Encounter for preprocedural cardiovascular examination: Secondary | ICD-10-CM | POA: Insufficient documentation

## 2017-11-11 DIAGNOSIS — M1611 Unilateral primary osteoarthritis, right hip: Secondary | ICD-10-CM | POA: Insufficient documentation

## 2017-11-11 LAB — TYPE AND SCREEN
ABO/RH(D): O POS
Antibody Screen: NEGATIVE

## 2017-11-11 LAB — URINALYSIS, ROUTINE W REFLEX MICROSCOPIC
Bilirubin Urine: NEGATIVE
GLUCOSE, UA: NEGATIVE mg/dL
HGB URINE DIPSTICK: NEGATIVE
Ketones, ur: NEGATIVE mg/dL
Leukocytes, UA: NEGATIVE
Nitrite: NEGATIVE
Protein, ur: NEGATIVE mg/dL
SPECIFIC GRAVITY, URINE: 1.012 (ref 1.005–1.030)
pH: 6 (ref 5.0–8.0)

## 2017-11-11 LAB — BASIC METABOLIC PANEL
ANION GAP: 6 (ref 5–15)
BUN: 7 mg/dL (ref 6–20)
CHLORIDE: 110 mmol/L (ref 101–111)
CO2: 26 mmol/L (ref 22–32)
Calcium: 9.4 mg/dL (ref 8.9–10.3)
Creatinine, Ser: 0.56 mg/dL (ref 0.44–1.00)
GFR calc Af Amer: >60 mL/min (ref >60)
GLUCOSE: 84 mg/dL (ref 65–99)
POTASSIUM: 3.6 mmol/L (ref 3.5–5.1)
Sodium: 142 mmol/L (ref 135–145)

## 2017-11-11 LAB — CBC WITH DIFFERENTIAL/PLATELET
BASOS ABS: 0.1 10*3/uL (ref 0.0–0.1)
Basophils Relative: 1 %
Eosinophils Absolute: 0.3 10*3/uL (ref 0.0–0.7)
Eosinophils Relative: 4 %
HCT: 30.5 % — ABNORMAL LOW (ref 36.0–46.0)
Hemoglobin: 10.8 g/dL — ABNORMAL LOW (ref 12.0–15.0)
LYMPHS ABS: 1.9 10*3/uL (ref 0.7–4.0)
LYMPHS PCT: 25 %
MCH: 29 pg (ref 26.0–34.0)
MCHC: 35.4 g/dL (ref 30.0–36.0)
MCV: 82 fL (ref 78.0–100.0)
MONO ABS: 0.7 10*3/uL (ref 0.1–1.0)
Monocytes Relative: 9 %
NEUTROS ABS: 4.8 10*3/uL (ref 1.7–7.7)
Neutrophils Relative %: 61 %
PLATELETS: 268 10*3/uL (ref 150–400)
RBC: 3.72 MIL/uL — AB (ref 3.87–5.11)
RDW: 16.2 % — AB (ref 11.5–15.5)
WBC: 7.7 10*3/uL (ref 4.0–10.5)

## 2017-11-11 LAB — PROTIME-INR
INR: 1.02
PROTHROMBIN TIME: 13.3 s (ref 11.4–15.2)

## 2017-11-11 LAB — SURGICAL PCR SCREEN
MRSA, PCR: NEGATIVE
Staphylococcus aureus: NEGATIVE

## 2017-11-11 LAB — APTT: APTT: 29 s (ref 24–36)

## 2017-11-15 DIAGNOSIS — M1611 Unilateral primary osteoarthritis, right hip: Secondary | ICD-10-CM | POA: Diagnosis present

## 2017-11-15 MED ORDER — BUPIVACAINE LIPOSOME 1.3 % IJ SUSP
20.0000 mL | Freq: Once | INTRAMUSCULAR | Status: AC
  Administered 2017-11-18: 20 mL
  Filled 2017-11-15: qty 20

## 2017-11-15 MED ORDER — TRANEXAMIC ACID 1000 MG/10ML IV SOLN
1000.0000 mg | INTRAVENOUS | Status: AC
  Administered 2017-11-18: 1000 mg via INTRAVENOUS
  Filled 2017-11-15: qty 1100

## 2017-11-15 MED ORDER — TRANEXAMIC ACID 1000 MG/10ML IV SOLN
2000.0000 mg | INTRAVENOUS | Status: AC
  Administered 2017-11-18: 2000 mg via TOPICAL
  Filled 2017-11-15: qty 20

## 2017-11-15 MED ORDER — LACTATED RINGERS IV SOLN
INTRAVENOUS | Status: DC
  Administered 2017-11-18 (×2): via INTRAVENOUS

## 2017-11-15 MED ORDER — CEFAZOLIN SODIUM-DEXTROSE 2-4 GM/100ML-% IV SOLN
2.0000 g | INTRAVENOUS | Status: DC
  Filled 2017-11-15: qty 100

## 2017-11-15 NOTE — H&P (Signed)
TOTAL HIP ADMISSION H&P  Patient is admitted for right total hip arthroplasty.  Subjective:  Chief Complaint: right hip pain  HPI: Evelyn Juarez, 51 y.o. female, has a history of pain and functional disability in the right hip(s) due to arthritis and patient has failed non-surgical conservative treatments for greater than 12 weeks to include NSAID's and/or analgesics, flexibility and strengthening excercises, weight reduction as appropriate and activity modification.  Onset of symptoms was gradual starting >10 years ago with gradually worsening course since that time.The patient noted no past surgery on the right hip(s).  Patient currently rates pain in the right hip at 10 out of 10 with activity. Patient has night pain, worsening of pain with activity and weight bearing, pain that interfers with activities of daily living and pain with passive range of motion. Patient has evidence of subchondral cysts, subchondral sclerosis and joint space narrowing by imaging studies. This condition presents safety issues increasing the risk of falls.   There is no current active infection.  There are no active problems to display for this patient.  Past Medical History:  Diagnosis Date  . Medical history non-contributory   . Vaginal delivery 1989    Past Surgical History:  Procedure Laterality Date  . DILATATION & CURETTAGE/HYSTEROSCOPY WITH TRUECLEAR N/A 08/27/2014   Procedure: DILATATION & CURETTAGE, HYSTEROSCOPY WITH TRUCLEAR;  Surgeon: Leslie AndreaJames E Tomblin II, MD;  Location: [facility];  Service: Gynecology;  Laterality: N/A;  . DILATION AND CURETTAGE OF UTERUS    . SHOULDER ARTHROSCOPY      No current facility-administered medications for this encounter.    Current Outpatient Medications  Medication Sig Dispense Refill Last Dose  . Biotin w/ Vitamins C & E (HAIR/SKIN/NAILS PO) Take 1 tablet by mouth daily.     . Cholecalciferol (VITAMIN D3 PO) Take 1 capsule by mouth daily.     Marland Kitchen. ibuprofen (ADVIL,MOTRIN)  200 MG tablet Take 400-600 mg by mouth every 6 (six) hours as needed for headache or moderate pain.     Marland Kitchen. HYDROcodone-acetaminophen (NORCO) 5-325 MG per tablet Take 1 tablet by mouth every 6 (six) hours as needed for moderate pain. (Patient not taking: Reported on 11/05/2017) 15 tablet 0 Completed Course at Unknown time   No Known Allergies  Social History   Tobacco Use  . Smoking status: Never Smoker  Substance Use Topics  . Alcohol use: Yes    Comment: occasional wine    Family History  Problem Relation Age of Onset  . Diabetes Mother      Review of Systems  Constitutional: Positive for diaphoresis.  HENT: Positive for hearing loss, sinus pain and tinnitus.   Eyes: Negative.   Respiratory: Negative.   Cardiovascular: Negative.   Gastrointestinal: Negative.   Genitourinary: Negative.   Musculoskeletal: Positive for joint pain.  Neurological: Negative.   Endo/Heme/Allergies: Negative.   Psychiatric/Behavioral: Negative.     Objective:  Physical Exam  Constitutional: She is oriented to person, place, and time. She appears well-developed and well-nourished.  HENT:  Head: Normocephalic and atraumatic.  Neck: Normal range of motion. Neck supple.  Cardiovascular: Intact distal pulses.  Respiratory: Effort normal.  Musculoskeletal:  inspection of right hip reveals no obvious deformity or muscle atrophy, no warmth, erythema, ecchymosis, or effusion, she is tender to palpation along right lateral hip just superior to the greater trochanter, also tender in the posterior hip near the gluteal medius and piriformis, she does have limitations in hip range of motion on the right side compared  to the left side, she does have pain with hip external and internal range of motion on the right side, also has slightly decreased hip flexion on the right side, straight leg negative bilaterally, FABER and FADIR positive on the right side, logroll negative, no leg length discrepancy noted, she does  have decreased strength in the right leg secondary to pain, would categorize this as 4-/5 in the right hip, hamstring, and quadriceps, otherwise strength 5/5 in bilateral lower extremities, sensation 2+ bilateral lower extremities  Neurological: She is alert and oriented to person, place, and time.  Skin: Skin is warm and dry.  Psychiatric: She has a normal mood and affect. Her behavior is normal. Judgment and thought content normal.    Vital signs in last 24 hours:    Labs:   Estimated body mass index is 25.04 kg/m as calculated from the following:   Height as of 11/11/17: 5\' 5"  (1.651 m).   Weight as of 11/11/17: 68.3 kg (150 lb 8 oz).   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 The treatment options including medical management, injection therapy, arthroscopy and arthroplasty were discussed at length. The risks and benefits of total hip arthroplasty were presented and reviewed. The risks due to aseptic loosening, infection, stiffness, dislocation/subluxation,  thromboembolic complications and other imponderables were discussed.  The patient acknowledged the explanation, agreed to proceed with the plan and consent was signed. Patient is being admitted for inpatient treatment for surgery, pain control, PT, OT, prophylactic antibiotics, VTE prophylaxis, progressive ambulation and ADL's and  discharge planning.The patient is planning to be discharged home with home health services.

## 2017-11-18 ENCOUNTER — Encounter (HOSPITAL_COMMUNITY)
Admission: RE | Disposition: A | Discharge status: Home Health | Payer: Self-pay | Source: Ambulatory Visit | Attending: Orthopedic Surgery

## 2017-11-18 ENCOUNTER — Inpatient Hospital Stay (HOSPITAL_COMMUNITY)
Admission: RE | Admit: 2017-11-18 | Discharge: 2017-11-19 | DRG: 470 | Disposition: A | Discharge status: Home Health | Payer: 59 | Source: Ambulatory Visit | Attending: Orthopedic Surgery | Admitting: Orthopedic Surgery

## 2017-11-18 ENCOUNTER — Other Ambulatory Visit: Payer: Self-pay

## 2017-11-18 ENCOUNTER — Encounter (HOSPITAL_COMMUNITY): Payer: Self-pay | Admitting: *Deleted

## 2017-11-18 ENCOUNTER — Inpatient Hospital Stay (HOSPITAL_COMMUNITY): Payer: 59

## 2017-11-18 ENCOUNTER — Inpatient Hospital Stay (HOSPITAL_COMMUNITY): Payer: 59 | Admitting: Anesthesiology

## 2017-11-18 DIAGNOSIS — M1611 Unilateral primary osteoarthritis, right hip: Secondary | ICD-10-CM | POA: Diagnosis present

## 2017-11-18 DIAGNOSIS — D62 Acute posthemorrhagic anemia: Secondary | ICD-10-CM | POA: Diagnosis not present

## 2017-11-18 DIAGNOSIS — Z9181 History of falling: Secondary | ICD-10-CM | POA: Diagnosis not present

## 2017-11-18 DIAGNOSIS — Z419 Encounter for procedure for purposes other than remedying health state, unspecified: Secondary | ICD-10-CM

## 2017-11-18 HISTORY — PX: TOTAL HIP ARTHROPLASTY: SHX124

## 2017-11-18 HISTORY — DX: Unspecified osteoarthritis, unspecified site: M19.90

## 2017-11-18 SURGERY — ARTHROPLASTY, HIP, TOTAL, ANTERIOR APPROACH
Anesthesia: General | Site: Hip | Laterality: Right

## 2017-11-18 MED ORDER — DIPHENHYDRAMINE HCL 12.5 MG/5ML PO ELIX: 12.5000 mg | ORAL_SOLUTION | ORAL | Status: DC | PRN

## 2017-11-18 MED ORDER — ASPIRIN EC 325 MG PO TBEC
325.0000 mg | DELAYED_RELEASE_TABLET | Freq: Every day | ORAL | Status: DC
  Administered 2017-11-19: 325 mg via ORAL
  Filled 2017-11-18: qty 1

## 2017-11-18 MED ORDER — ACETAMINOPHEN 325 MG PO TABS
650.0000 mg | ORAL_TABLET | ORAL | Status: DC | PRN
  Administered 2017-11-18: 650 mg via ORAL
  Filled 2017-11-18: qty 2

## 2017-11-18 MED ORDER — CHLORHEXIDINE GLUCONATE 4 % EX LIQD: 60.0000 mL | Freq: Once | CUTANEOUS | Status: DC

## 2017-11-18 MED ORDER — PHENOL 1.4 % MT LIQD: 1.0000 | OROMUCOSAL | Status: DC | PRN

## 2017-11-18 MED ORDER — ROCURONIUM BROMIDE 100 MG/10ML IV SOLN
INTRAVENOUS | Status: DC | PRN
  Administered 2017-11-18: 10 mg via INTRAVENOUS
  Administered 2017-11-18: 50 mg via INTRAVENOUS

## 2017-11-18 MED ORDER — BUPIVACAINE-EPINEPHRINE 0.25% -1:200000 IJ SOLN
INTRAMUSCULAR | Status: DC | PRN
  Administered 2017-11-18: 50 mL

## 2017-11-18 MED ORDER — OXYCODONE-ACETAMINOPHEN 5-325 MG PO TABS: 1.0000 | ORAL_TABLET | ORAL | 0 refills | Status: DC | PRN

## 2017-11-18 MED ORDER — METHOCARBAMOL 500 MG PO TABS
ORAL_TABLET | ORAL | Status: AC
  Filled 2017-11-18: qty 1

## 2017-11-18 MED ORDER — ONDANSETRON HCL 4 MG/2ML IJ SOLN
INTRAMUSCULAR | Status: DC | PRN
  Administered 2017-11-18 (×2): 4 mg via INTRAVENOUS

## 2017-11-18 MED ORDER — BISACODYL 5 MG PO TBEC: 5.0000 mg | DELAYED_RELEASE_TABLET | Freq: Every day | ORAL | Status: DC | PRN

## 2017-11-18 MED ORDER — GABAPENTIN 300 MG PO CAPS
300.0000 mg | ORAL_CAPSULE | Freq: Three times a day (TID) | ORAL | Status: DC
  Administered 2017-11-18 – 2017-11-19 (×4): 300 mg via ORAL
  Filled 2017-11-18 (×4): qty 1

## 2017-11-18 MED ORDER — ONDANSETRON HCL 4 MG/2ML IJ SOLN
4.0000 mg | Freq: Four times a day (QID) | INTRAMUSCULAR | Status: DC | PRN
  Administered 2017-11-18: 4 mg via INTRAVENOUS
  Filled 2017-11-18: qty 2

## 2017-11-18 MED ORDER — OXYCODONE HCL 5 MG PO TABS
ORAL_TABLET | ORAL | Status: AC
  Filled 2017-11-18: qty 2

## 2017-11-18 MED ORDER — MIDAZOLAM HCL 2 MG/2ML IJ SOLN
INTRAMUSCULAR | Status: AC
  Filled 2017-11-18: qty 2

## 2017-11-18 MED ORDER — OXYCODONE HCL 5 MG PO TABS: 5.0000 mg | ORAL_TABLET | Freq: Once | ORAL | Status: DC | PRN

## 2017-11-18 MED ORDER — FENTANYL CITRATE (PF) 100 MCG/2ML IJ SOLN
INTRAMUSCULAR | Status: DC | PRN
  Administered 2017-11-18: 50 ug via INTRAVENOUS
  Administered 2017-11-18: 100 ug via INTRAVENOUS
  Administered 2017-11-18 (×2): 50 ug via INTRAVENOUS

## 2017-11-18 MED ORDER — ASPIRIN EC 325 MG PO TBEC: 325.0000 mg | DELAYED_RELEASE_TABLET | Freq: Two times a day (BID) | ORAL | 0 refills | Status: DC

## 2017-11-18 MED ORDER — ACETAMINOPHEN 650 MG RE SUPP: 650.0000 mg | RECTAL | Status: DC | PRN

## 2017-11-18 MED ORDER — HYDROMORPHONE HCL 1 MG/ML IJ SOLN
0.2500 mg | INTRAMUSCULAR | Status: DC | PRN
  Administered 2017-11-18 (×2): 0.5 mg via INTRAVENOUS

## 2017-11-18 MED ORDER — 0.9 % SODIUM CHLORIDE (POUR BTL) OPTIME
TOPICAL | Status: DC | PRN
  Administered 2017-11-18: 1000 mL

## 2017-11-18 MED ORDER — SODIUM CHLORIDE 0.9 % IV SOLN
1000.0000 mg | Freq: Once | INTRAVENOUS | Status: AC
  Administered 2017-11-18: 1000 mg via INTRAVENOUS
  Filled 2017-11-18: qty 10

## 2017-11-18 MED ORDER — OXYCODONE HCL 5 MG PO TABS
5.0000 mg | ORAL_TABLET | ORAL | Status: DC | PRN
  Filled 2017-11-18 (×2): qty 1

## 2017-11-18 MED ORDER — DEXAMETHASONE SODIUM PHOSPHATE 10 MG/ML IJ SOLN
10.0000 mg | Freq: Once | INTRAMUSCULAR | Status: AC
Start: 2017-11-19 — End: 2017-11-19
  Administered 2017-11-19: 10 mg via INTRAVENOUS
  Filled 2017-11-18: qty 1

## 2017-11-18 MED ORDER — OXYCODONE HCL 5 MG/5ML PO SOLN: 5.0000 mg | Freq: Once | ORAL | Status: DC | PRN

## 2017-11-18 MED ORDER — METHOCARBAMOL 500 MG PO TABS
500.0000 mg | ORAL_TABLET | Freq: Four times a day (QID) | ORAL | Status: DC | PRN
  Administered 2017-11-18 – 2017-11-19 (×4): 500 mg via ORAL
  Filled 2017-11-18 (×3): qty 1

## 2017-11-18 MED ORDER — PROPOFOL 10 MG/ML IV BOLUS
INTRAVENOUS | Status: AC
  Filled 2017-11-18: qty 20

## 2017-11-18 MED ORDER — TIZANIDINE HCL 2 MG PO TABS: 2.0000 mg | ORAL_TABLET | Freq: Four times a day (QID) | ORAL | 0 refills | Status: DC | PRN

## 2017-11-18 MED ORDER — SUGAMMADEX SODIUM 500 MG/5ML IV SOLN
INTRAVENOUS | Status: DC | PRN
  Administered 2017-11-18: 200 mg via INTRAVENOUS

## 2017-11-18 MED ORDER — PROPOFOL 10 MG/ML IV BOLUS
INTRAVENOUS | Status: DC | PRN
  Administered 2017-11-18: 190 mg via INTRAVENOUS

## 2017-11-18 MED ORDER — LIDOCAINE HCL (CARDIAC) 20 MG/ML IV SOLN
INTRAVENOUS | Status: DC | PRN
  Administered 2017-11-18: 80 mg via INTRAVENOUS

## 2017-11-18 MED ORDER — DOCUSATE SODIUM 100 MG PO CAPS
100.0000 mg | ORAL_CAPSULE | Freq: Two times a day (BID) | ORAL | Status: DC
  Administered 2017-11-18 – 2017-11-19 (×2): 100 mg via ORAL
  Filled 2017-11-18 (×2): qty 1

## 2017-11-18 MED ORDER — KCL IN DEXTROSE-NACL 20-5-0.45 MEQ/L-%-% IV SOLN
INTRAVENOUS | Status: DC
  Administered 2017-11-18: 17:00:00 via INTRAVENOUS
  Filled 2017-11-18: qty 1000

## 2017-11-18 MED ORDER — CELECOXIB 200 MG PO CAPS
200.0000 mg | ORAL_CAPSULE | Freq: Two times a day (BID) | ORAL | Status: DC
  Administered 2017-11-18 – 2017-11-19 (×2): 200 mg via ORAL
  Filled 2017-11-18 (×2): qty 1

## 2017-11-18 MED ORDER — SENNOSIDES-DOCUSATE SODIUM 8.6-50 MG PO TABS: 1.0000 | ORAL_TABLET | Freq: Every evening | ORAL | Status: DC | PRN

## 2017-11-18 MED ORDER — ALUM & MAG HYDROXIDE-SIMETH 200-200-20 MG/5ML PO SUSP: 30.0000 mL | ORAL | Status: DC | PRN

## 2017-11-18 MED ORDER — MIDAZOLAM HCL 5 MG/5ML IJ SOLN
INTRAMUSCULAR | Status: DC | PRN
  Administered 2017-11-18 (×2): 1 mg via INTRAVENOUS

## 2017-11-18 MED ORDER — FENTANYL CITRATE (PF) 250 MCG/5ML IJ SOLN
INTRAMUSCULAR | Status: AC
  Filled 2017-11-18: qty 5

## 2017-11-18 MED ORDER — LACTATED RINGERS IV SOLN
INTRAVENOUS | Status: DC
  Administered 2017-11-18: 07:00:00 via INTRAVENOUS

## 2017-11-18 MED ORDER — DEXTROSE 5 % IV SOLN: 500.0000 mg | Freq: Four times a day (QID) | INTRAVENOUS | Status: DC | PRN

## 2017-11-18 MED ORDER — HYDROMORPHONE HCL 1 MG/ML IJ SOLN: 0.5000 mg | INTRAMUSCULAR | Status: DC | PRN

## 2017-11-18 MED ORDER — FLEET ENEMA 7-19 GM/118ML RE ENEM: 1.0000 | ENEMA | Freq: Once | RECTAL | Status: DC | PRN

## 2017-11-18 MED ORDER — METOCLOPRAMIDE HCL 5 MG/ML IJ SOLN: 5.0000 mg | Freq: Three times a day (TID) | INTRAMUSCULAR | Status: DC | PRN

## 2017-11-18 MED ORDER — OXYCODONE HCL 5 MG PO TABS
10.0000 mg | ORAL_TABLET | ORAL | Status: DC | PRN
  Administered 2017-11-18 – 2017-11-19 (×5): 10 mg via ORAL
  Filled 2017-11-18 (×3): qty 2

## 2017-11-18 MED ORDER — METOCLOPRAMIDE HCL 5 MG PO TABS: 5.0000 mg | ORAL_TABLET | Freq: Three times a day (TID) | ORAL | Status: DC | PRN

## 2017-11-18 MED ORDER — ONDANSETRON HCL 4 MG PO TABS: 4.0000 mg | ORAL_TABLET | Freq: Four times a day (QID) | ORAL | Status: DC | PRN

## 2017-11-18 MED ORDER — BUPIVACAINE-EPINEPHRINE 0.25% -1:200000 IJ SOLN
INTRAMUSCULAR | Status: AC
  Filled 2017-11-18: qty 1

## 2017-11-18 MED ORDER — DEXAMETHASONE SODIUM PHOSPHATE 10 MG/ML IJ SOLN
INTRAMUSCULAR | Status: DC | PRN
  Administered 2017-11-18: 10 mg via INTRAVENOUS

## 2017-11-18 MED ORDER — MENTHOL 3 MG MT LOZG: 1.0000 | LOZENGE | OROMUCOSAL | Status: DC | PRN

## 2017-11-18 MED ORDER — HYDROMORPHONE HCL 1 MG/ML IJ SOLN
INTRAMUSCULAR | Status: AC
  Administered 2017-11-18: 0.5 mg via INTRAVENOUS
  Filled 2017-11-18: qty 1

## 2017-11-18 MED ORDER — CEFAZOLIN SODIUM-DEXTROSE 2-3 GM-%(50ML) IV SOLR
INTRAVENOUS | Status: DC | PRN
  Administered 2017-11-18: 2 g via INTRAVENOUS

## 2017-11-18 SURGICAL SUPPLY — 43 items
BAG DECANTER FOR FLEXI CONT (MISCELLANEOUS) ×3 IMPLANT
BLADE SAW SGTL 18X1.27X75 (BLADE) ×2 IMPLANT
BLADE SAW SGTL 18X1.27X75MM (BLADE) ×1
CAPT HIP TOTAL 2 ×3 IMPLANT
COVER PERINEAL POST (MISCELLANEOUS) ×3 IMPLANT
COVER SURGICAL LIGHT HANDLE (MISCELLANEOUS) ×3 IMPLANT
DRAPE C-ARM 42X72 X-RAY (DRAPES) ×3 IMPLANT
DRAPE STERI IOBAN 125X83 (DRAPES) ×3 IMPLANT
DRAPE U-SHAPE 47X51 STRL (DRAPES) ×6 IMPLANT
DRSG AQUACEL AG ADV 3.5X10 (GAUZE/BANDAGES/DRESSINGS) ×3 IMPLANT
DURAPREP 26ML APPLICATOR (WOUND CARE) ×3 IMPLANT
ELECT BLADE 4.0 EZ CLEAN MEGAD (MISCELLANEOUS) ×3
ELECT REM PT RETURN 9FT ADLT (ELECTROSURGICAL) ×3
ELECTRODE BLDE 4.0 EZ CLN MEGD (MISCELLANEOUS) ×1 IMPLANT
ELECTRODE REM PT RTRN 9FT ADLT (ELECTROSURGICAL) ×1 IMPLANT
FACESHIELD WRAPAROUND (MASK) ×6 IMPLANT
GLOVE BIO SURGEON STRL SZ7.5 (GLOVE) ×3 IMPLANT
GLOVE BIO SURGEON STRL SZ8.5 (GLOVE) ×3 IMPLANT
GLOVE BIOGEL PI IND STRL 8 (GLOVE) ×1 IMPLANT
GLOVE BIOGEL PI IND STRL 9 (GLOVE) ×1 IMPLANT
GLOVE BIOGEL PI INDICATOR 8 (GLOVE) ×2
GLOVE BIOGEL PI INDICATOR 9 (GLOVE) ×2
GOWN STRL REUS W/ TWL LRG LVL3 (GOWN DISPOSABLE) ×1 IMPLANT
GOWN STRL REUS W/ TWL XL LVL3 (GOWN DISPOSABLE) ×2 IMPLANT
GOWN STRL REUS W/TWL LRG LVL3 (GOWN DISPOSABLE) ×2
GOWN STRL REUS W/TWL XL LVL3 (GOWN DISPOSABLE) ×4
KIT BASIN OR (CUSTOM PROCEDURE TRAY) ×3 IMPLANT
KIT ROOM TURNOVER OR (KITS) ×3 IMPLANT
MANIFOLD NEPTUNE II (INSTRUMENTS) ×3 IMPLANT
NEEDLE HYPO 22GX1.5 SAFETY (NEEDLE) ×6 IMPLANT
NS IRRIG 1000ML POUR BTL (IV SOLUTION) ×3 IMPLANT
PACK TOTAL JOINT (CUSTOM PROCEDURE TRAY) ×3 IMPLANT
PAD ARMBOARD 7.5X6 YLW CONV (MISCELLANEOUS) ×6 IMPLANT
SUT VIC AB 1 CTX 36 (SUTURE) ×2
SUT VIC AB 1 CTX36XBRD ANBCTR (SUTURE) ×1 IMPLANT
SUT VIC AB 2-0 CT1 27 (SUTURE) ×2
SUT VIC AB 2-0 CT1 TAPERPNT 27 (SUTURE) ×1 IMPLANT
SUT VIC AB 3-0 CT1 27 (SUTURE) ×2
SUT VIC AB 3-0 CT1 TAPERPNT 27 (SUTURE) ×1 IMPLANT
SYR CONTROL 10ML LL (SYRINGE) ×6 IMPLANT
TOWEL OR 17X24 6PK STRL BLUE (TOWEL DISPOSABLE) ×3 IMPLANT
TOWEL OR 17X26 10 PK STRL BLUE (TOWEL DISPOSABLE) ×3 IMPLANT
TRAY CATH 16FR W/PLASTIC CATH (SET/KITS/TRAYS/PACK) IMPLANT

## 2017-11-18 NOTE — Discharge Instructions (Signed)

## 2017-11-18 NOTE — Anesthesia Preprocedure Evaluation (Addendum)
Anesthesia Evaluation  Patient identified by MRN, date of birth, ID band Patient awake    Reviewed: Allergy & Precautions, NPO status , Patient's Chart, lab work & pertinent test results  Airway Mallampati: II   Neck ROM: Full    Dental no notable dental hx.    Pulmonary neg pulmonary ROS,    breath sounds clear to auscultation       Cardiovascular negative cardio ROS   Rhythm:Regular Rate:Normal     Neuro/Psych    GI/Hepatic negative GI ROS, Neg liver ROS,   Endo/Other  negative endocrine ROS  Renal/GU negative Renal ROS     Musculoskeletal   Abdominal   Peds  Hematology  (+) anemia ,   Anesthesia Other Findings   Reproductive/Obstetrics                             Anesthesia Physical Anesthesia Plan  ASA: II  Anesthesia Plan: General   Post-op Pain Management:    Induction: Intravenous  PONV Risk Score and Plan: Treatment may vary due to age or medical condition, Dexamethasone and Ondansetron  Airway Management Planned: Oral ETT  Additional Equipment:   Intra-op Plan:   Post-operative Plan: Extubation in OR  Informed Consent: I have reviewed the patients History and Physical, chart, labs and discussed the procedure including the risks, benefits and alternatives for the proposed anesthesia with the patient or authorized representative who has indicated his/her understanding and acceptance.   Dental advisory given  Plan Discussed with: CRNA  Anesthesia Plan Comments:        Anesthesia Quick Evaluation

## 2017-11-18 NOTE — Evaluation (Signed)
Physical Therapy Evaluation Patient Details Name: Evelyn Juarez MRN: [id number] DOB: 04/25/1966 Today's Date: 11/18/2017   History of Present Illness  Pt is a 51 y/o female s/p elective R THA, direct anterior approach. Pt has no pertinent PMH.   Clinical Impression  Pt is s/p surgery above with deficits below. PTA, pt was independent with functional mobility. Upon eval, pt limited by post op pain and nausea. Limited distance to chair this session secondary to nausea and required min guard assist with use of RW. Reports husband will be able to assist at d/c and will need DME below. Follow up recommendations per MD arrangements. Will continue to follow acutely to maximize functional mobility independence and safety.     Follow Up Recommendations DC plan and follow up therapy as arranged by surgeon;Supervision for mobility/OOB    Equipment Recommendations  Rolling walker with 5" wheels;3in1 (PT)    Recommendations for Other Services       Precautions / Restrictions Precautions Precautions: None Precaution Comments: Reviewed THA handout with pt. Limited tolerance secondary to pain and nausea.  Restrictions Weight Bearing Restrictions: Yes RLE Weight Bearing: Weight bearing as tolerated      Mobility  Bed Mobility Overal bed mobility: Needs Assistance Bed Mobility: Supine to Sit     Supine to sit: Min assist     General bed mobility comments: Min A for RLE management. Use of bed rails and elevated HOB. Verbal cues for sequencing.   Transfers Overall transfer level: Needs assistance Equipment used: Rolling walker (2 wheeled) Transfers: Sit to/from Stand Sit to Stand: Min guard         General transfer comment: Min guard for safety. Verbal cues for safe hand placement.   Ambulation/Gait Ambulation/Gait assistance: Min guard Ambulation Distance (Feet): 3 Feet Assistive device: Rolling walker (2 wheeled) Gait Pattern/deviations: Step-to pattern;Decreased step length -  right;Decreased step length - left;Decreased weight shift to right;Antalgic Gait velocity: Decreased  Gait velocity interpretation: Below normal speed for age/gender General Gait Details: Slow, antalgic gait secondary to pain in R hip. Min guard for safety and cues for sequencing with RW. Distance limited secondary to nausea.   Stairs            Wheelchair Mobility    Modified Rankin (Stroke Patients Only)       Balance Overall balance assessment: Needs assistance Sitting-balance support: No upper extremity supported;Feet supported Sitting balance-Leahy Scale: Good     Standing balance support: Bilateral upper extremity supported;During functional activity Standing balance-Leahy Scale: Poor Standing balance comment: Reliant on UE support                              Pertinent Vitals/Pain Pain Assessment: 0-10 Pain Score: 3  Pain Location: R hip  Pain Descriptors / Indicators: Aching;Operative site guarding Pain Intervention(s): Limited activity within patient's tolerance;Monitored during session;Repositioned    Home Living Family/patient expects to be discharged to:: Private residence Living Arrangements: Spouse/significant other Available Help at Discharge: Family;Available 24 hours/day Type of Home: House Home Access: Stairs to enter Entrance Stairs-Rails: Left Entrance Stairs-Number of Steps: 3 Home Layout: Two level Home Equipment: Shower seat - built in      Prior Function Level of Independence: Independent               Hand Dominance   Dominant Hand: Right    Extremity/Trunk Assessment   Upper Extremity Assessment Upper Extremity Assessment: Overall WFL for tasks assessed  Lower Extremity Assessment Lower Extremity Assessment: RLE deficits/detail RLE Deficits / Details: Sensory in tact. Deficits consistent with post op pain and weakness. Able to perform ther ex below.     Cervical / Trunk Assessment Cervical / Trunk  Assessment: Normal  Communication   Communication: No difficulties  Cognition Arousal/Alertness: Awake/alert Behavior During Therapy: WFL for tasks assessed/performed Overall Cognitive Status: Within Functional Limits for tasks assessed                                        General Comments General comments (skin integrity, edema, etc.): Pt's husband and son present during session.     Exercises Total Joint Exercises Ankle Circles/Pumps: AROM;Both;20 reps Quad Sets: AROM;Right;10 reps Heel Slides: (verbally reviewed to add to HEP tonight )   Assessment/Plan    PT Assessment Patient needs continued PT services  PT Problem List Decreased strength;Decreased balance;Decreased activity tolerance;Decreased mobility;Decreased knowledge of use of DME;Decreased knowledge of precautions;Pain       PT Treatment Interventions DME instruction;Gait training;Stair training;Functional mobility training;Therapeutic activities;Therapeutic exercise;Balance training;Neuromuscular re-education;Patient/family education    PT Goals (Current goals can be found in the Care Plan section)  Acute Rehab PT Goals Patient Stated Goal: to feel better  PT Goal Formulation: With patient Time For Goal Achievement: 11/25/17 Potential to Achieve Goals: Good    Frequency 7X/week   Barriers to discharge        Co-evaluation               AM-PAC PT "6 Clicks" Daily Activity  Outcome Measure Difficulty turning over in bed (including adjusting bedclothes, sheets and blankets)?: A Little Difficulty moving from lying on back to sitting on the side of the bed? : Unable Difficulty sitting down on and standing up from a chair with arms (e.g., wheelchair, bedside commode, etc,.)?: Unable Help needed moving to and from a bed to chair (including a wheelchair)?: A Little Help needed walking in hospital room?: A Little Help needed climbing 3-5 steps with a railing? : A Lot 6 Click Score: 13     End of Session Equipment Utilized During Treatment: Gait belt Activity Tolerance: No increased pain;Treatment limited secondary to medical complications (Comment)(nausea ) Patient left: in chair;with call bell/phone within reach;with family/visitor present Nurse Communication: Mobility status PT Visit Diagnosis: Other abnormalities of gait and mobility (R26.89);Pain Pain - Right/Left: Right Pain - part of body: Hip    Time: [id number] PT Time Calculation (min) (ACUTE ONLY): 23 min   Charges:   PT Evaluation $PT Eval Low Complexity: 1 Low PT Treatments $Gait Training: 8-22 mins   PT G Codes:        Evelyn Juarez, PT, DPT  Acute Rehabilitation Services  Pager: [phone]   Lehman PromBrittany S Zaelyn Barbary 11/18/2017, 3:16 PM

## 2017-11-18 NOTE — Anesthesia Procedure Notes (Signed)
Performed by: Sharee HolsterMassagee, Terry, MD

## 2017-11-18 NOTE — Interval H&P Note (Signed)
History and Physical Interval Note:  11/18/2017 7:07 AM  Evelyn LennertPamela B Juarez  has presented today for surgery, with the diagnosis of RIGHT HIP OSTEOARTHRITIS  The various methods of treatment have been discussed with the patient and family. After consideration of risks, benefits and other options for treatment, the patient has consented to  Procedure(s): TOTAL HIP ARTHROPLASTY ANTERIOR APPROACH (Right) as a surgical intervention .  The patient's history has been reviewed, patient examined, no change in status, stable for surgery.  I have reviewed the patient's chart and labs.  Questions were answered to the patient's satisfaction.     Nestor LewandowskyFrank J Liyat Faulkenberry

## 2017-11-18 NOTE — Op Note (Signed)
OPERATIVE REPORT    DATE OF PROCEDURE:  11/18/2017       PREOPERATIVE DIAGNOSIS:  RIGHT HIP OSTEOARTHRITIS                                                          POSTOPERATIVE DIAGNOSIS:  RIGHT HIP OSTEOARTHRITIS                                                           PROCEDURE: Anterior R total hip arthroplasty using a 50 mm DePuy Pinnacle  Cup, [place]ody E[employer] Hole Eliminator, 0-degree polyethylene liner, a +1 32 mm ceramic head, a 3 hi Depuy Triloc stem   SURGEON: Nestor LewandowskyFrank J Annsley Akkerman    ASSISTANT:   Tomi LikensEric K. [employer] Energy[employer]illips PA-C  (present throughout entire procedure and necessary for timely completion of the procedure)   ANESTHESIA: Spinal BLOOD LOSS: 300 FLUID REPLACEMENT: 1500cc  crystalloid Antibiotic: 2gm ancef Tranexamic Acid: 1gm IV, 2gm Topical COMPLICATIONS: none    INDICATIONS FOR PROCEDURE: A 51 y.o. year-old With  RIGHT HIP OSTEOARTHRITIS   for 3 years, x-rays show bone-on-bone arthritic changes, and osteophytes. Despite conservative measures with observation, anti-inflammatory medicine, narcotics, use of a cane, has severe unremitting pain and can ambulate only a few blocks before resting. Patient desires elective R total hip arthroplasty to decrease pain and increase function. The risks, benefits, and alternatives were discussed at length including but not limited to the risks of infection, bleeding, nerve injury, stiffness, blood clots, the need for revision surgery, cardiopulmonary complications, among others, and they were willing to proceed. Questions answered     PROCEDURE IN DETAIL: The patient was identified by armband,  [facility] preoperative [facility], taken to the operating room , appropriate anesthetic monitors  were attached and  anesthesia was induced with the patienton the gurney. The HANA boots were applied to the feet and he was then transferred to the HANA table with a peroneal post and support underneath the non-operative  le, which was locked in 5 lb traction. Theoperative lower extremity was then prepped and draped in the usual sterile fashion from just above the iliac crest to the knee. And a timeout procedure was performed. We then made a 10 cm incision along the interval at the leading edge of the tensor fascia lata of starting at 2 cm lateral to and 2 cm distal to the ASIS. Small bleeders in the skin and subcutaneous tissue identified and cauterized we dissected down to the fascia and made an incision in the fascia allowing us to elevate the fascia of the tensor muscle and exploited the interval between the rectus and the tensor fascia lata. A Hohmann retractor was then placed along the superior neck of the femur and a Cobra retractor along the inferior neck of the femur we teed the capsule starting out at the superior anterior aspect of the acetabulum going distally and made the T along the neck both leaflets of the T were tagged with #2 Ethibond suture. Cobra retractors were then placed along the inferior and superior neck allowing us to perform a standard neck  cut and removed the femoral head with a power corkscrew. We then placed a right angle Hohmann retractor along the anterior aspect of the acetabulum a spiked Cobra in the cotyloid notch and posteriorly a Muelller retractor. We then sequentially reamed up to a 49 mm basket reamer obtaining good coverage in all quadrants, verified by C-arm imaging. Under C-arm control with and hammered into place a 50 mm Pinnacle cup in 45 of abduction and 15 of anteversion. The cup seated nicely and required no supplemental screws. We then placed a central hole Eliminator and a 0 polyethylene liner. The foot was then externally rotated to 110, the HANA elevator was placed around the flare of the greater trochanter and the limb was extended and abducted delivering the proximal femur up into the wound. A medium Hohmann retractor was placed over the greater trochanter and a Mueller  retractor along the posterior femoral neck completing the exposure. We then performed releases superiorly and and inferiorly of the capsule going back to the pirformis fossa superiorly and to the lesser trochanter inferiorly. We then entered the proximal femur with the box cutting offset chisel followed by, a canal sounder, the chili pepper and broaching up to a 3 broach. This seated nicely and we reamed the calcar. A trial reduction was performed with a 1 mm 32 mm head.The limb lengths were excellent the hip was stable in 90 of external rotation. At this point the trial components removed and we hammered into place a # 3 hi  Offset Tri-Lock stem with Gryption coating. A + 1 32 mm ceramic ball was then hammered into place the hip was reduced and final C-arm images obtained. The wound was thoroughly irrigated with normal saline solution. We repaired the ant capsule and the tensor fascia lot a with running 0 vicryl suture. the subcutaneous tissue was closed with 2-0 and 3-0 Vicryl suture followed by an Aquacil dressing. At this point the patient was awaken and transferred to hospital gurney without difficulty. The subcutaneous tissue with 0 and 2-0 undyed Vicryl suture and the skin with running  3-0 vicryl subcuticular suture. Aquacil dressing was applied. The patient was then unclamped, rolled supine, awaken extubated and taken to recovery room without difficulty in stable condition.   Nestor LewandowskyFrank J Pallavi Clifton 11/18/2017, 11:07 AM

## 2017-11-18 NOTE — Transfer of Care (Signed)
Immediate Anesthesia Transfer of Care Note  Patient: Evelyn Juarez  Procedure(s) Performed: TOTAL HIP ARTHROPLASTY ANTERIOR APPROACH (Right Hip)  Patient Location: PACU  Anesthesia Type:General  Level of Consciousness: awake, oriented, drowsy, pateint uncooperative, confused and responds to stimulation  Airway & Oxygen Therapy: Patient Spontanous Breathing and Patient connected to face mask oxygen  Post-op Assessment: Report given to RN, Post -op Vital signs reviewed and stable and Patient moving all extremities  Post vital signs: Reviewed and stable  Last Vitals:  Vitals:   11/18/17 0716  BP: 126/74  Pulse: 72  Resp: 20  Temp: 36.9 C  SpO2: 100%    Last Pain:  Vitals:   11/18/17 0710  TempSrc: Oral         Complications: No apparent anesthesia complications

## 2017-11-18 NOTE — Anesthesia Procedure Notes (Addendum)
Procedure Name: Intubation Date/Time: 11/18/2017 9:55 AM Performed by: Rica Koyanagi, MD Pre-anesthesia Checklist: Patient identified, Emergency Drugs available, Suction available, Patient being monitored and Timeout performed Patient Re-evaluated:Patient Re-evaluated prior to induction Oxygen Delivery Method: Circle system utilized Preoxygenation: Pre-oxygenation with 100% oxygen Induction Type: IV induction and Cricoid Pressure applied Ventilation: Mask ventilation without difficulty Laryngoscope Size: Mac Grade View: Grade III Tube type: Oral Tube size: 7.5 mm Number of attempts: 2 Airway Equipment and Method: Stylet Secured at: 21 cm Tube secured with: Tape Dental Injury: Bloody posterior oropharynx  Difficulty Due To: Difficulty was unanticipated Future Recommendations: Recommend- induction with short-acting agent, and alternative techniques readily available Comments: Anterior. First attempt esophageal. Removed, ventilated, Cords visualized with head lift/cricoid pressure

## 2017-11-18 NOTE — Clinical Social Work Note (Signed)
Clinical Social Worker received referral for possible ST-SNF placement.  Chart reviewed.  PT/OT recommending home with supervision and DME - husband available to assist.  Spoke with RN Case Manager who will follow up with patient to discuss home health needs/DME needs.    CSW signing off - please re consult if social work needs arise.  Evelyn Juarez, [place] [phone]

## 2017-11-19 ENCOUNTER — Encounter (HOSPITAL_COMMUNITY): Payer: Self-pay | Admitting: Orthopedic Surgery

## 2017-11-19 LAB — CBC
HCT: 26.1 % — ABNORMAL LOW (ref 36.0–46.0)
HEMOGLOBIN: 9.1 g/dL — AB (ref 12.0–15.0)
MCH: 29.5 pg (ref 26.0–34.0)
MCHC: 34.9 g/dL (ref 30.0–36.0)
MCV: 84.7 fL (ref 78.0–100.0)
PLATELETS: 233 10*3/uL (ref 150–400)
RBC: 3.08 MIL/uL — ABNORMAL LOW (ref 3.87–5.11)
RDW: 16.5 % — ABNORMAL HIGH (ref 11.5–15.5)
WBC: 16.4 10*3/uL — ABNORMAL HIGH (ref 4.0–10.5)

## 2017-11-19 NOTE — Progress Notes (Signed)
PATIENT ID: Evelyn Juarez  MRN: [id number]  DOB/AGE:  02/24/1966 / 51 y.o.  1 Day Post-Op Procedure(s) (LRB): TOTAL HIP ARTHROPLASTY ANTERIOR APPROACH (Right)    PROGRESS NOTE Subjective: Patient is alert, oriented, no Nausea, no Vomiting, yes passing gas. Taking PO well. Denies SOB, Chest or Calf Pain. Using Incentive Spirometer, PAS in place. Ambulate WBAT, has walked in room, Patient reports pain as 2/10 .    Objective: Vital signs in last 24 hours: Vitals:   11/18/17 1500 11/18/17 1952 11/19/17 0029 11/19/17 0435  BP: 121/70 125/65 120/60 109/60  Pulse: 73 78 81 73  Resp: 14 14 15 14   Temp: (!) 97.5 F (36.4 C) 97.7 F (36.5 C) 98 F (36.7 C) 98.3 F (36.8 C)  TempSrc: Oral Oral Oral Oral  SpO2: 96% 100% 95% 99%  Weight:      Height:          Intake/Output from previous day: I/O last 3 completed shifts: In: 2420 [P.O.:720; I.V.:1600; IV Piggyback:100] Out: 325 [Blood:325]   Intake/Output this shift: No intake/output data recorded.   LABORATORY DATA: Recent Labs    11/19/17 0501  WBC 16.4*  HGB 9.1*  HCT 26.1*  PLT 233    Examination: Neurologically intact ABD soft Neurovascular intact Sensation intact distally Intact pulses distally Dorsiflexion/Plantar flexion intact Incision: dressing C/D/I No cellulitis present Compartment soft}  Assessment:   1 Day Post-Op Procedure(s) (LRB): TOTAL HIP ARTHROPLASTY ANTERIOR APPROACH (Right) ADDITIONAL DIAGNOSIS: Expected Acute Blood Loss Anemia,   Plan: PT/OT WBAT, AROM and PROM  DVT Prophylaxis:  SCDx72hrs, ASA 325 mg BID x 2 weeks DISCHARGE PLAN: Home, today when passes physical therapy DISCHARGE NEEDS: HHPT, Walker and 3-in-1 comode seat     Nestor LewandowskyFrank J Nakshatra Klose 11/19/2017, 8:40 AM

## 2017-11-19 NOTE — Care Management Note (Signed)
Case Management Note  Patient Details  Name: Evelyn Juarez MRN: [id number] Date of Birth: 04/10/1966  Subjective/Objective:   [age]yr old female s/p right anterior hip arthroplasty, anterior approach.               Action/Plan:  Case manager spoke with patient concerning discharge plan and DME. Choice for home health was offered, referral was called to Sharlet Salinaan Phillips,RN, [facility] Liaison. Patient will have family support at discharge.    Expected Discharge Date:  11/19/17               Expected Discharge Plan:  Home w Home Health Services  In-House Referral:  NA  Discharge planning Services  CM Consult  Post Acute Care Choice:  Durable Medical Equipment, Home Health Choice offered to:  Patient  DME Arranged:  3-N-1, Walker rolling DME Agency:  [facility]  [facility]  [facility]  [facility]  Status of Service:  Completed, signed off  If discussed at Long Length of Stay Meetings, dates discussed:    Additional Comments:  Durenda GuthrieBrady, [name], RN 11/19/2017, 2:35 PM

## 2017-11-19 NOTE — Progress Notes (Signed)
Physical Therapy Treatment Patient Details Name: Evelyn Juarez B Vanhecke MRN: [id number] DOB: 11/11/1966 Today's Date: 11/19/2017    History of Present Illness Pt is a 51 y/o female s/p elective R THA, direct anterior approach. Pt has no pertinent PMH.     PT Comments    Patient is progressing well toward mobility goals. Patient needs to practice stairs next session.      Follow Up Recommendations  DC plan and follow up therapy as arranged by surgeon;Supervision for mobility/OOB     Equipment Recommendations  Rolling walker with 5" wheels;3in1 (PT)    Recommendations for Other Services       Precautions / Restrictions Precautions Precautions: None Restrictions Weight Bearing Restrictions: Yes RLE Weight Bearing: Weight bearing as tolerated    Mobility  Bed Mobility Overal bed mobility: Modified Independent Bed Mobility: Supine to Sit;Sit to Supine           General bed mobility comments: increased effort  Transfers Overall transfer level: Needs assistance Equipment used: Rolling walker (2 wheeled) Transfers: Sit to/from Stand Sit to Stand: Supervision         General transfer comment: supervision for safety  Ambulation/Gait Ambulation/Gait assistance: Supervision Ambulation Distance (Feet): 200 Feet Assistive device: Rolling walker (2 wheeled) Gait Pattern/deviations: Decreased step length - left;Decreased weight shift to right;Step-through pattern;Decreased step length - right Gait velocity: Decreased    General Gait Details: cues for posture and sequencing   Stairs            Wheelchair Mobility    Modified Rankin (Stroke Patients Only)       Balance Overall balance assessment: Needs assistance Sitting-balance support: No upper extremity supported;Feet supported Sitting balance-Leahy Scale: Good     Standing balance support: During functional activity;Single extremity supported Standing balance-Leahy Scale: Poor Standing balance comment: pt  able to static stand with single UE support                            Cognition Arousal/Alertness: Awake/alert Behavior During Therapy: WFL for tasks assessed/performed Overall Cognitive Status: Within Functional Limits for tasks assessed                                        Exercises Total Joint Exercises Ankle Circles/Pumps: AROM;Both;10 reps Quad Sets: AROM;Right;10 reps Short Arc Quad: AROM;Right;10 reps Heel Slides: AROM;Right;10 reps Hip ABduction/ADduction: AROM;AAROM;Right;10 reps Long Arc Quad: AROM;Right;10 reps    General Comments        Pertinent Vitals/Pain Pain Assessment: Faces Faces Pain Scale: Hurts little more Pain Location: R hip  Pain Descriptors / Indicators: Guarding;Sore Pain Intervention(s): Limited activity within patient's tolerance;Monitored during session;Premedicated before session;Repositioned    Home Living                      Prior Function            PT Goals (current goals can now be found in the care plan section) Acute Rehab PT Goals PT Goal Formulation: With patient Time For Goal Achievement: 11/25/17 Potential to Achieve Goals: Good Progress towards PT goals: Progressing toward goals    Frequency    7X/week      PT Plan Current plan remains appropriate    Co-evaluation              AM-PAC PT "6 Clicks" Daily Activity  Outcome Measure  Difficulty turning over in bed (including adjusting bedclothes, sheets and blankets)?: A Little Difficulty moving from lying on back to sitting on the side of the bed? : A Little Difficulty sitting down on and standing up from a chair with arms (e.g., wheelchair, bedside commode, etc,.)?: Unable Help needed moving to and from a bed to chair (including a wheelchair)?: A Little Help needed walking in hospital room?: A Little Help needed climbing 3-5 steps with a railing? : A Little 6 Click Score: 16    End of Session Equipment Utilized  During Treatment: Gait belt Activity Tolerance: Patient tolerated treatment well Patient left: with call bell/phone within reach;in bed Nurse Communication: Mobility status PT Visit Diagnosis: Other abnormalities of gait and mobility (R26.89);Pain Pain - Right/Left: Right Pain - part of body: Hip     Time: 0912-0938 PT Time Calculation (min) (ACUTE ONLY): 26 min  Charges:  $Gait Training: 8-22 mins $Therapeutic Exercise: 8-22 mins                    G Codes:       Erline LevineKellyn Isley Weisheit, PTA Pager: [phone]     Carolynne EdouardKellyn R Corderro Koloski 11/19/2017, 9:50 AM

## 2017-11-19 NOTE — Progress Notes (Signed)
Physical Therapy Treatment Patient Details Name: Evelyn Juarez MRN: [id number] DOB: 04/26/1966 Today's Date: 11/19/2017    History of Present Illness Pt is a 51 y/o female s/p elective R THA, direct anterior approach. Pt has no pertinent PMH.     PT Comments    Patient is making good progress with PT.  From a mobility standpoint anticipate patient will be ready for DC home when medically ready.    Follow Up Recommendations  DC plan and follow up therapy as arranged by surgeon;Supervision for mobility/OOB     Equipment Recommendations  Rolling walker with 5" wheels;3in1 (PT)    Recommendations for Other Services       Precautions / Restrictions Precautions Precautions: None Restrictions Weight Bearing Restrictions: Yes RLE Weight Bearing: Weight bearing as tolerated    Mobility  Bed Mobility               General bed mobility comments: pt OOB in chair upon arrival  Transfers Overall transfer level: Needs assistance Equipment used: Rolling walker (2 wheeled) Transfers: Sit to/from Stand Sit to Stand: Supervision         General transfer comment: supervision for safety  Ambulation/Gait Ambulation/Gait assistance: Supervision Ambulation Distance (Feet): 150 Feet Assistive device: Rolling walker (2 wheeled) Gait Pattern/deviations: Decreased step length - left;Decreased weight shift to right;Step-through pattern;Decreased step length - right Gait velocity: Decreased    General Gait Details: slow, steady gait   Stairs Stairs: Yes   Stair Management: Two rails;Step to pattern;Forwards;One rail Left Number of Stairs: (2 steps X2) General stair comments: cues for sequencing and technique  Wheelchair Mobility    Modified Rankin (Stroke Patients Only)       Balance Overall balance assessment: Needs assistance Sitting-balance support: No upper extremity supported;Feet supported Sitting balance-Leahy Scale: Good     Standing balance support: During  functional activity;Single extremity supported Standing balance-Leahy Scale: Poor Standing balance comment: pt able to static stand with single UE support                            Cognition Arousal/Alertness: Awake/alert Behavior During Therapy: WFL for tasks assessed/performed Overall Cognitive Status: Within Functional Limits for tasks assessed                                        Exercises Total Joint Exercises Hip ABduction/ADduction: AROM;Right;Standing;5 reps Knee Flexion: AROM;Right;5 reps;Standing Marching in Standing: AROM;Right;5 reps;Standing Standing Hip Extension: AROM;Right;5 reps;Standing    General Comments General comments (skin integrity, edema, etc.): family present      Pertinent Vitals/Pain Pain Assessment: Faces Faces Pain Scale: Hurts little more Pain Location: R hip  Pain Descriptors / Indicators: Sore Pain Intervention(s): Monitored during session;Limited activity within patient's tolerance;Repositioned;Ice applied    Home Living                      Prior Function            PT Goals (current goals can now be found in the care plan section) Acute Rehab PT Goals PT Goal Formulation: With patient Time For Goal Achievement: 11/25/17 Potential to Achieve Goals: Good Progress towards PT goals: Progressing toward goals    Frequency    7X/week      PT Plan Current plan remains appropriate    Co-evaluation  AM-PAC PT "6 Clicks" Daily Activity  Outcome Measure  Difficulty turning over in bed (including adjusting bedclothes, sheets and blankets)?: A Little Difficulty moving from lying on back to sitting on the side of the bed? : A Little Difficulty sitting down on and standing up from a chair with arms (e.g., wheelchair, bedside commode, etc,.)?: Unable Help needed moving to and from a bed to chair (including a wheelchair)?: A Little Help needed walking in hospital room?: A  Little Help needed climbing 3-5 steps with a railing? : A Little 6 Click Score: 16    End of Session Equipment Utilized During Treatment: Gait belt Activity Tolerance: Patient tolerated treatment well Patient left: with call bell/phone within reach;in chair;with family/visitor present Nurse Communication: Mobility status PT Visit Diagnosis: Other abnormalities of gait and mobility (R26.89);Pain Pain - Right/Left: Right Pain - part of body: Hip     Time: [id number] PT Time Calculation (min) (ACUTE ONLY): 18 min  Charges:  $Gait Training: 8-22 mins                    G Codes:       Erline LevineKellyn Evanell Redlich, PTA Pager: [phone]     Carolynne EdouardKellyn R Mliss Wedin 11/19/2017, 3:08 PM

## 2017-11-19 NOTE — Discharge Summary (Signed)
Patient ID: Evelyn Juarez MRN: [id number] DOB/AGE: 1966/03/21 51 y.o.  Admit date: 11/18/2017 Discharge date: 11/19/2017  Admission Diagnoses:  Principal Problem:   Osteoarthritis of right hip Active Problems:   Primary osteoarthritis of right hip   Discharge Diagnoses:  Same  Past Medical History:  Diagnosis Date  . Medical history non-contributory   . Osteoarthritis 2018   RT HIP  . Vaginal delivery 1989    Surgeries: Procedure(s): TOTAL HIP ARTHROPLASTY ANTERIOR APPROACH on 11/18/2017   Consultants:   Discharged Condition: Improved  Hospital Course: Evelyn Juarez is an 51 y.o. female who was admitted 11/18/2017 for operative treatment ofOsteoarthritis of right hip. Patient has severe unremitting pain that affects sleep, daily activities, and work/hobbies. After pre-op clearance the patient was taken to the operating room on 11/18/2017 and underwent  Procedure(s): TOTAL HIP ARTHROPLASTY ANTERIOR APPROACH.    Patient was given perioperative antibiotics:  Anti-infectives (From admission, onward)   Start     Dose/Rate Route Frequency Ordered Stop   11/18/17 0830  ceFAZolin (ANCEF) IVPB 2g/100 mL premix  Status:  Discontinued     2 g 200 mL/hr over 30 Minutes Intravenous To [facility] 11/15/17 1337 11/18/17 1337       Patient was given sequential compression devices, early ambulation, and chemoprophylaxis to prevent DVT.  Patient benefited maximally from hospital stay and there were no complications.    Recent vital signs:  Patient Vitals for the past 24 hrs:  BP Temp Temp src Pulse Resp SpO2  11/19/17 0435 109/60 98.3 F (36.8 C) Oral 73 14 99 %  11/19/17 0029 120/60 98 F (36.7 C) Oral 81 15 95 %  11/18/17 1952 125/65 97.7 F (36.5 C) Oral 78 14 100 %  11/18/17 1500 121/70 (!) 97.5 F (36.4 C) Oral 73 14 96 %  11/18/17 1315 122/73 (!) 97.5 F (36.4 C) - 69 12 98 %  11/18/17 1300 131/76 - - 67 10 100 %  11/18/17 1245 (!) 149/79 - - 71 10 100 %   11/18/17 1239 (!) 146/81 - - 67 11 99 %  11/18/17 1230 (!) 141/87 - - 75 10 100 %  11/18/17 1225 - - - 69 12 100 %  11/18/17 1215 (!) 141/87 - - 70 11 97 %  11/18/17 1200 (!) 146/79 - - 75 11 98 %  11/18/17 1148 137/74 (!) 97.5 F (36.4 C) - 74 13 99 %     Recent laboratory studies:  Recent Labs    11/19/17 0501  WBC 16.4*  HGB 9.1*  HCT 26.1*  PLT 233     Discharge Medications:   Allergies as of 11/19/2017   No Known Allergies     Medication List    STOP taking these medications   HYDROcodone-acetaminophen 5-325 MG tablet Commonly known as:  NORCO   ibuprofen 200 MG tablet Commonly known as:  ADVIL,MOTRIN     TAKE these medications   aspirin EC 325 MG tablet Take 1 tablet (325 mg total) by mouth 2 (two) times daily.   HAIR/SKIN/NAILS PO Take 1 tablet by mouth daily.   oxyCODONE-acetaminophen 5-325 MG tablet Commonly known as:  ROXICET Take 1 tablet by mouth every 4 (four) hours as needed.   tiZANidine 2 MG tablet Commonly known as:  ZANAFLEX Take 1 tablet (2 mg total) by mouth every 6 (six) hours as needed for muscle spasms.   VITAMIN D3 PO Take 1 capsule by mouth daily.  Durable Medical Equipment  (From admission, onward)        Start     Ordered   11/18/17 1343  DME Walker rolling  Once    Question:  Patient needs a walker to treat with the following condition  Answer:  Status post right hip replacement   11/18/17 1342   11/18/17 1343  DME 3 n 1  Once     11/18/17 1342   11/18/17 1343  DME Bedside commode  Once    Question:  Patient needs a bedside commode to treat with the following condition  Answer:  Status post right hip replacement   11/18/17 1342       Discharge Care Instructions  (From admission, onward)        Start     Ordered   11/19/17 0000  Change dressing    Comments:  If window on Mepilex has more than 40% drainage   11/19/17 0843      Diagnostic Studies: Dg Chest 2 View  Result Date:  11/11/2017 CLINICAL DATA:  Preop exam for total hip arthroplasty. EXAM: CHEST  2 VIEW COMPARISON:  12/23/2008 FINDINGS: Normal heart size and mediastinal contours. No acute infiltrate or edema. No effusion or pneumothorax. No acute osseous findings. Right glenohumeral arthroplasty. Left glenohumeral osteoarthritis with osteochondromatosis. IMPRESSION: No evidence of active disease. Electronically Signed   By: Marnee SpringJonathon  Watts M.D.   On: 11/11/2017 09:18   Dg C-arm 1-60 Min  Result Date: 11/18/2017 CLINICAL DATA:  Right total hip arthroplasty. EXAM: DG C-ARM 61-120 MIN; OPERATIVE RIGHT HIP WITH PELVIS COMPARISON:  None. FINDINGS: Two intraoperative fluoroscopic spot views show a right total hip arthroplasty. No complicating features. IMPRESSION: Right total hip arthroplasty without immediate complicating feature. Electronically Signed   By: Leanna BattlesMelinda  Blietz M.D.   On: 11/18/2017 11:16   Dg Hip Operative Unilat W Or W/o Pelvis Right  Result Date: 11/18/2017 CLINICAL DATA:  Right total hip arthroplasty. EXAM: DG C-ARM 61-120 MIN; OPERATIVE RIGHT HIP WITH PELVIS COMPARISON:  None. FINDINGS: Two intraoperative fluoroscopic spot views show a right total hip arthroplasty. No complicating features. IMPRESSION: Right total hip arthroplasty without immediate complicating feature. Electronically Signed   By: Leanna BattlesMelinda  Blietz M.D.   On: 11/18/2017 11:16    Disposition: 01-Home or Self Care  Discharge Instructions    Call MD / Call 911   Complete by:  As directed    If you experience chest pain or shortness of breath, CALL 911 and be transported to the hospital emergency room.  If you develope a fever above 101 F, pus (white drainage) or increased drainage or redness at the wound, or calf pain, call your surgeon's office.   Change dressing   Complete by:  As directed    If window on Mepilex has more than 40% drainage   Constipation Prevention   Complete by:  As directed    Drink plenty of fluids.  Prune  juice may be helpful.  You may use a stool softener, such as Colace (over the counter) 100 mg twice a day.  Use MiraLax (over the counter) for constipation as needed.   Diet - low sodium heart healthy   Complete by:  As directed    Driving restrictions   Complete by:  As directed    No driving for 2 weeks   Increase activity slowly as tolerated   Complete by:  As directed       Follow-up Information    Gean Birchwoodowan, Kazmir Oki, MD Follow  up in 2 week(s).   Specialty:  Orthopedic Surgery Contact information: [address] [place] [address] [phone]            Signed: Nestor LewandowskyFrank J Kc Sedlak 11/19/2017, 8:44 AM

## 2017-11-21 NOTE — Anesthesia Postprocedure Evaluation (Signed)
Anesthesia Post Note  Patient: Evelyn Juarez  Procedure(s) Performed: TOTAL HIP ARTHROPLASTY ANTERIOR APPROACH (Right Hip)     Patient location during evaluation: PACU Anesthesia Type: General Level of consciousness: awake and sedated Pain management: pain level controlled Vital Signs Assessment: post-procedure vital signs reviewed and stable Respiratory status: spontaneous breathing, nonlabored ventilation, respiratory function stable and patient connected to nasal cannula oxygen Cardiovascular status: blood pressure returned to baseline and stable Postop Assessment: no apparent nausea or vomiting Anesthetic complications: no    Last Vitals:  Vitals:   11/19/17 0029 11/19/17 0435  BP: 120/60 109/60  Pulse: 81 73  Resp: 15 14  Temp: 36.7 C 36.8 C  SpO2: 95% 99%    Last Pain:  Vitals:   11/19/17 0841  TempSrc:   PainSc: 0-No pain                 Solash Tullo,JAMES TERRILL

## 2018-12-10 IMAGING — CR DG CHEST 2V
2 series · 2 of 2 positions shown · non-contrast
Comparison: 12/23/2008

CLINICAL DATA: Preop exam for total hip arthroplasty.

EXAM:
CHEST  2 VIEW

[w chest pa]
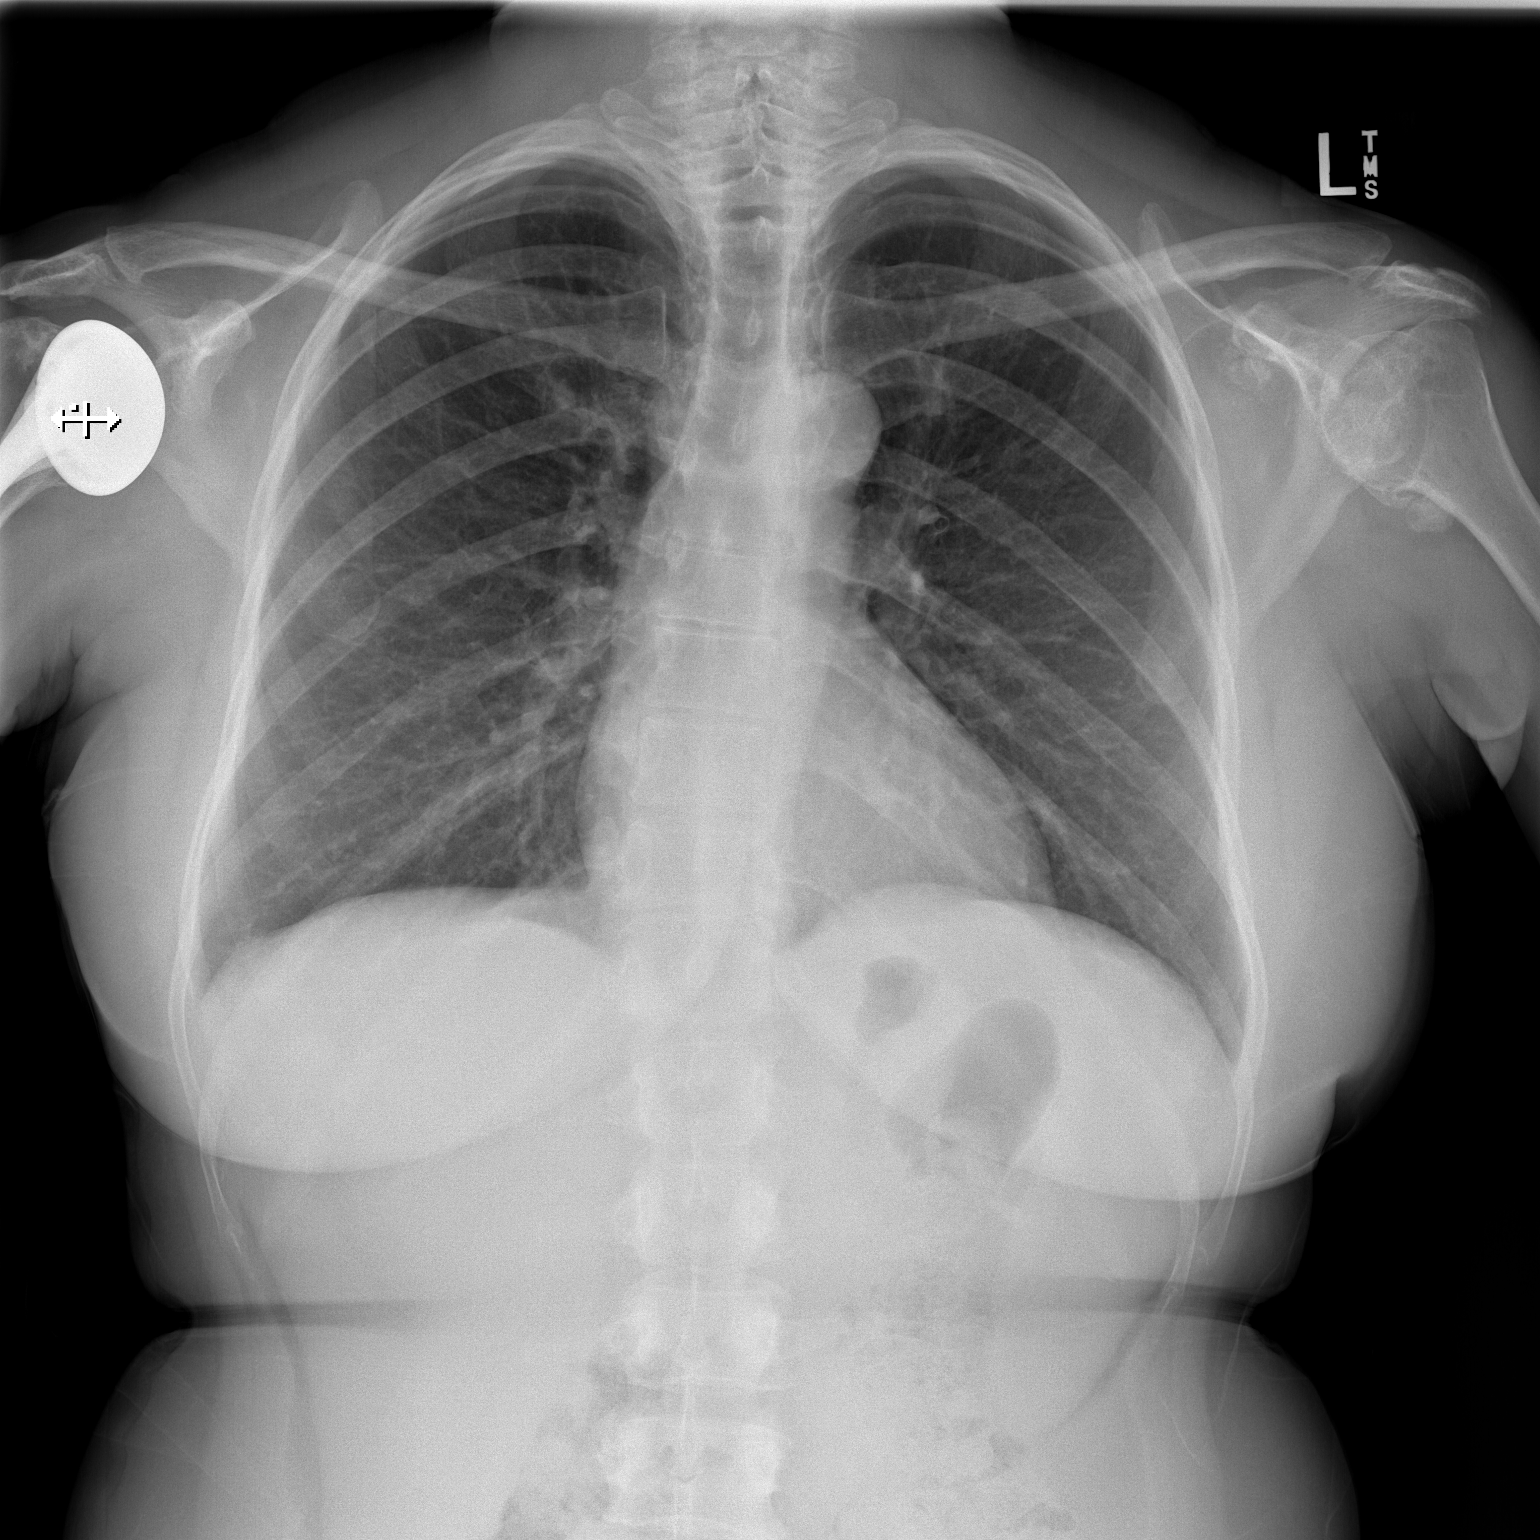

[w chest lat]
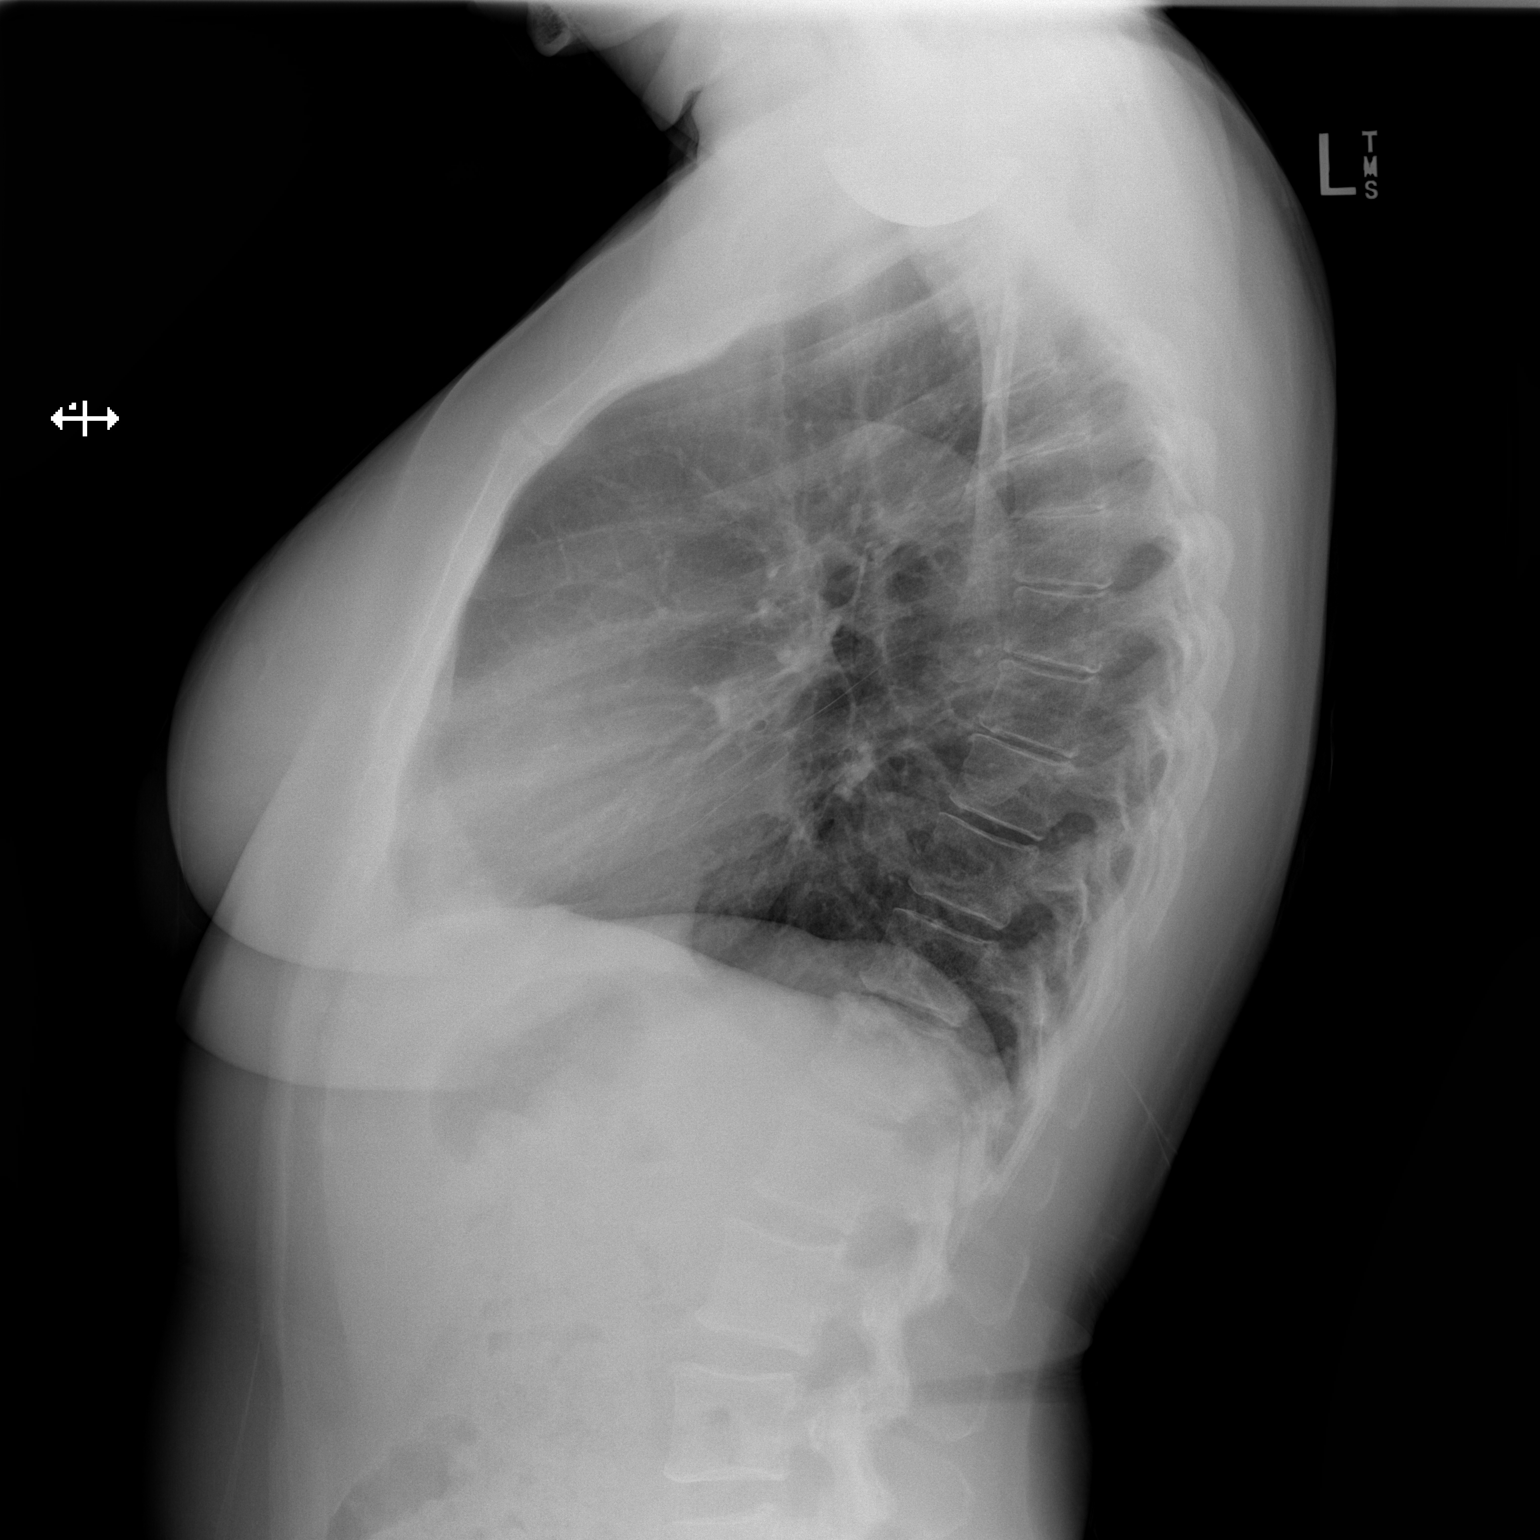

[2 of 2 positions shown; findings below may reference images not displayed]

FINDINGS: Normal heart size and mediastinal contours. No acute infiltrate or
edema. No effusion or pneumothorax. No acute osseous findings.

Right glenohumeral arthroplasty. Left glenohumeral osteoarthritis
with osteochondromatosis.
IMPRESSION: No evidence of active disease.

## 2018-12-17 IMAGING — RF DG HIP (WITH PELVIS) OPERATIVE*R*
1 series · 2 of 2 positions shown · non-contrast
Comparison: None.

CLINICAL DATA: Right total hip arthroplasty.

EXAM:
DG C-ARM 61-120 MIN; OPERATIVE RIGHT HIP WITH PELVIS

[Series 1: run · 2 of 2 slices shown]
[im 1/2]
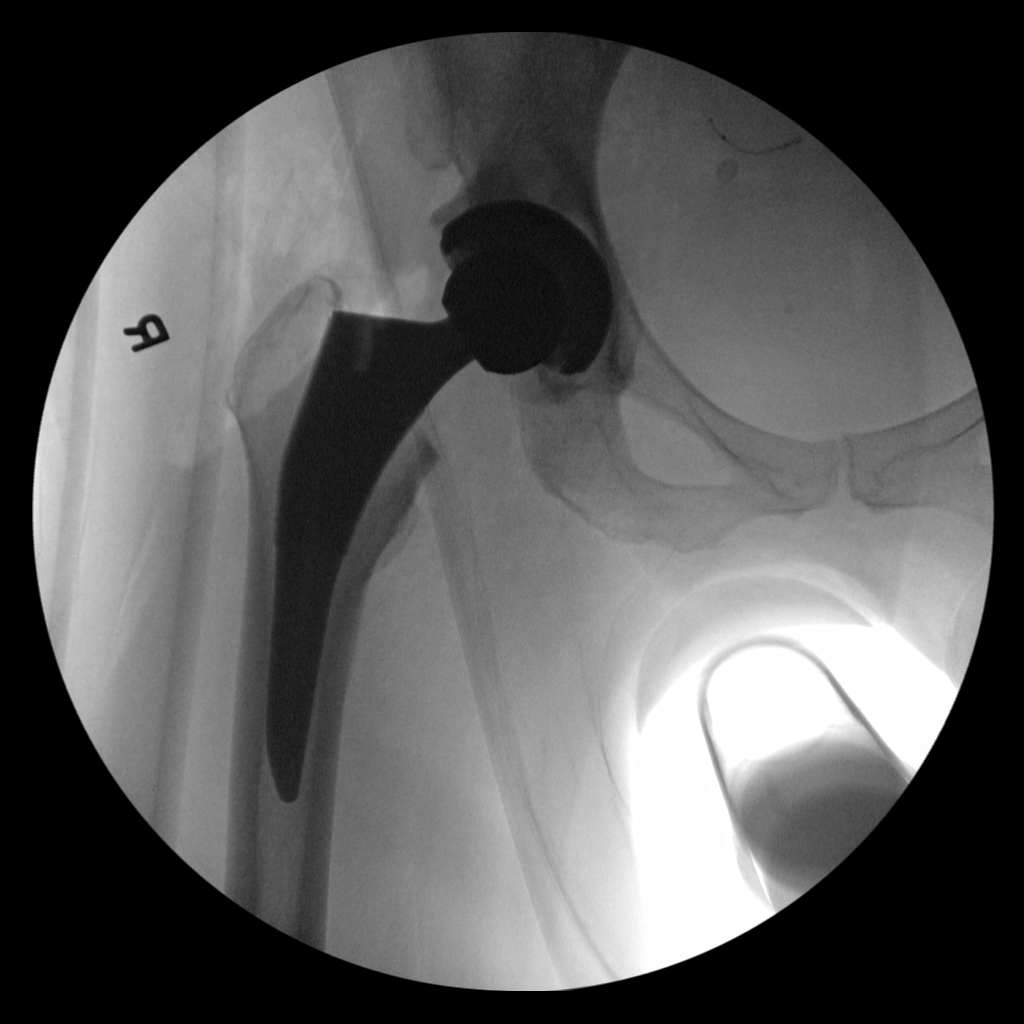
[im 2/2]
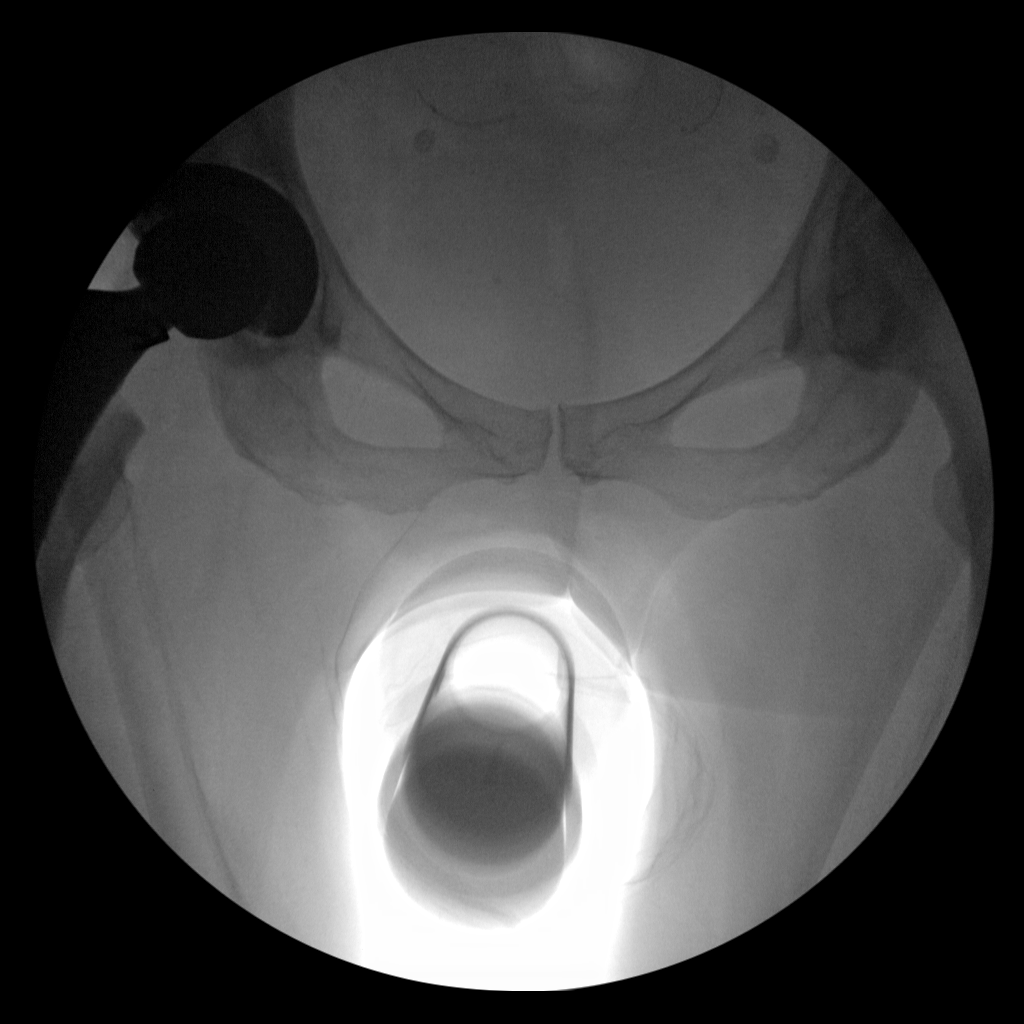

[2 of 2 positions shown; findings below may reference images not displayed]

FINDINGS: Two intraoperative fluoroscopic spot views show a right total hip
arthroplasty. No complicating features.
IMPRESSION: Right total hip arthroplasty without immediate complicating feature.

## 2019-12-09 ENCOUNTER — Other Ambulatory Visit: Payer: Self-pay

## 2019-12-09 DIAGNOSIS — Z20822 Contact with and (suspected) exposure to covid-19: Secondary | ICD-10-CM

## 2019-12-10 LAB — NOVEL CORONAVIRUS, NAA: SARS-CoV-2, NAA: NOT DETECTED

## 2020-02-19 ENCOUNTER — Ambulatory Visit: Payer: Self-pay

## 2020-02-19 ENCOUNTER — Ambulatory Visit: Payer: 59 | Attending: Internal Medicine

## 2020-02-19 DIAGNOSIS — Z23 Encounter for immunization: Secondary | ICD-10-CM

## 2020-02-19 NOTE — Progress Notes (Signed)
   Covid-19 Vaccination Clinic  Name:  Evelyn Juarez    MRN: [id number] DOB: 1966-05-29  02/19/2020  [name] was observed post Covid-19 immunization for 15 minutes without incident. She was provided with Vaccine Information Sheet and instruction to access the V-Safe system.   [name] was instructed to call 911 with any severe reactions post vaccine: Marland Kitchen Difficulty breathing  . Swelling of face and throat  . A fast heartbeat  . A bad rash all over body  . Dizziness and weakness   Immunizations Administered    Name Date Dose VIS Date Route   Pfizer COVID-19 Vaccine 02/19/2020  4:30 PM 0.3 mL 11/20/2019 Intramuscular   Manufacturer: [employer], [employer]   Lot: [id number]   NDC: [address]-9

## 2020-03-14 ENCOUNTER — Ambulatory Visit: Payer: 59 | Attending: Internal Medicine

## 2020-03-14 ENCOUNTER — Ambulatory Visit: Payer: 59

## 2020-03-14 ENCOUNTER — Ambulatory Visit: Payer: Self-pay

## 2020-03-14 DIAGNOSIS — Z23 Encounter for immunization: Secondary | ICD-10-CM

## 2020-03-14 NOTE — Progress Notes (Signed)
   Covid-19 Vaccination Clinic  Name:  Evelyn Juarez    MRN: [id number] DOB: [birth year]  03/14/2020  Ms. [name] was observed post Covid-19 immunization for 15 minutes without incident. She was provided with Vaccine Information Sheet and instruction to access the V-Safe system.   Ms. [name] was instructed to call 911 with any severe reactions post vaccine: Marland Kitchen Difficulty breathing  . Swelling of face and throat  . A fast heartbeat  . A bad rash all over body  . Dizziness and weakness   Immunizations Administered    Name Date Dose VIS Date Route   Pfizer COVID-19 Vaccine 03/14/2020  4:46 PM 0.3 mL 11/20/2019 Intramuscular   Manufacturer: [employer], [employer]   Lot: [id number]   NDC: [address]-8

## 2020-09-06 DIAGNOSIS — R87619 Unspecified abnormal cytological findings in specimens from cervix uteri: Secondary | ICD-10-CM | POA: Insufficient documentation

## 2020-09-06 DIAGNOSIS — M1990 Unspecified osteoarthritis, unspecified site: Secondary | ICD-10-CM | POA: Insufficient documentation

## 2023-01-17 ENCOUNTER — Encounter: Payer: Self-pay | Admitting: Gastroenterology

## 2023-02-21 ENCOUNTER — Ambulatory Visit (AMBULATORY_SURGERY_CENTER): Payer: 59

## 2023-02-21 ENCOUNTER — Encounter: Payer: Self-pay | Admitting: Gastroenterology

## 2023-02-21 VITALS — Ht 65.0 in | Wt 140.0 lb

## 2023-02-21 DIAGNOSIS — Z1211 Encounter for screening for malignant neoplasm of colon: Secondary | ICD-10-CM

## 2023-02-21 MED ORDER — NA SULFATE-K SULFATE-MG SULF 17.5-3.13-1.6 GM/177ML PO SOLN: 1.0000 | Freq: Once | ORAL | 0 refills | Status: AC

## 2023-02-21 NOTE — Progress Notes (Signed)

## 2023-03-19 ENCOUNTER — Ambulatory Visit (AMBULATORY_SURGERY_CENTER): Payer: 59 | Admitting: Gastroenterology

## 2023-03-19 ENCOUNTER — Encounter: Payer: Self-pay | Admitting: Gastroenterology

## 2023-03-19 VITALS — BP 122/61 | HR 59 | Temp 98.6°F | Resp 12 | Ht 63.0 in | Wt 139.0 lb

## 2023-03-19 DIAGNOSIS — Z1211 Encounter for screening for malignant neoplasm of colon: Secondary | ICD-10-CM | POA: Diagnosis present

## 2023-03-19 DIAGNOSIS — D122 Benign neoplasm of ascending colon: Secondary | ICD-10-CM | POA: Diagnosis not present

## 2023-03-19 DIAGNOSIS — D123 Benign neoplasm of transverse colon: Secondary | ICD-10-CM

## 2023-03-19 MED ORDER — SODIUM CHLORIDE 0.9 % IV SOLN: 500.0000 mL | Freq: Once | INTRAVENOUS | Status: DC

## 2023-03-19 NOTE — Progress Notes (Signed)
[facility] History and Physical   Primary Care Physician:  Patient, No Pcp Per   Reason for Procedure:   Colon cancer screening  Plan:    colonoscopy     HPI: Evelyn Juarez is a 57 y.o. female  here for colonoscopy screening - first time exam. Patient denies any bowel symptoms at this time. No family history of colon cancer known. Otherwise feels well without any cardiopulmonary symptoms.   I have discussed risks / benefits of anesthesia and endoscopic procedure with Evelyn Juarez and they wish to proceed with the exams as outlined today.    Past Medical History:  Diagnosis Date   Medical history non-contributory    Osteoarthritis 2018   RT HIP   Vaginal delivery 1989    Past Surgical History:  Procedure Laterality Date   DILATATION & CURETTAGE/HYSTEROSCOPY WITH TRUECLEAR N/A 08/27/2014   Procedure: DILATATION & CURETTAGE, HYSTEROSCOPY WITH TRUCLEAR;  Surgeon: Roselle Locus II, MD;  Location: [facility];  Service: Gynecology;  Laterality: N/A;   DILATION AND CURETTAGE OF UTERUS     SHOULDER ARTHROSCOPY     TOTAL HIP ARTHROPLASTY Right 11/18/2017   TOTAL HIP ARTHROPLASTY Right 11/18/2017   Procedure: TOTAL HIP ARTHROPLASTY ANTERIOR APPROACH;  Surgeon: Gean Birchwood, MD;  Location: [facility] OR;  Service: Orthopedics;  Laterality: Right;    Prior to Admission medications   Medication Sig Start Date End Date Taking? Authorizing Provider  amoxicillin (AMOXIL) 500 MG capsule Take 500 mg by mouth as directed. Only for dental procedures Patient not taking: Reported on 02/21/2023 12/31/22   [provider]  Biotin w/ Vitamins C & E (HAIR/SKIN/NAILS PO) Take 1 tablet by mouth daily.    [provider]  Cholecalciferol (VITAMIN D3 PO) Take 1 capsule by mouth daily.    [provider]  Ferrous Sulfate (IRON PO) Take 1 tablet by mouth daily.    [provider]    Current Outpatient Medications  Medication Sig Dispense Refill   amoxicillin  (AMOXIL) 500 MG capsule Take 500 mg by mouth as directed. Only for dental procedures (Patient not taking: Reported on 02/21/2023)     Biotin w/ Vitamins C & E (HAIR/SKIN/NAILS PO) Take 1 tablet by mouth daily.     Cholecalciferol (VITAMIN D3 PO) Take 1 capsule by mouth daily.     Ferrous Sulfate (IRON PO) Take 1 tablet by mouth daily.     Current Facility-Administered Medications  Medication Dose Route Frequency Provider Last Rate Last Admin   0.9 %  sodium chloride infusion  500 mL Intravenous Once Emmakate Hypes, Willaim Rayas, MD        Allergies as of 03/19/2023   (No Known Allergies)    Family History  Problem Relation Age of Onset   Diabetes Mother    Colon cancer Neg Hx    Colon polyps Neg Hx    Esophageal cancer Neg Hx    Rectal cancer Neg Hx    Stomach cancer Neg Hx     Social History   Socioeconomic History   Marital status: Married    Spouse name: Not on file   Number of children: Not on file   Years of education: Not on file   Highest education level: Not on file  Occupational History   Not on file  Tobacco Use   Smoking status: Never   Smokeless tobacco: Never  Vaping Use   Vaping Use: Never used  Substance and Sexual Activity   Alcohol use: Yes  Comment: occasional wine   Drug use: No   Sexual activity: Yes  Other Topics Concern   Not on file  Social History Narrative   Not on file   Social Determinants of Health   Financial Resource Strain: Not on file  Food Insecurity: Not on file  Transportation Needs: Not on file  Physical Activity: Not on file  Stress: Not on file  Social Connections: Not on file  Intimate Partner Violence: Not on file    Review of Systems: All other review of systems negative except as mentioned in the HPI.  Physical Exam: Vital signs BP 119/70   Pulse 71   Temp 98.6 F (37 C)   Ht 5\' 3"  (1.6 m)   Wt 139 lb (63 kg)   LMP 08/08/2014   SpO2 100%   BMI 24.62 kg/m   General:   Alert,  Well-developed, pleasant and  cooperative in NAD Lungs:  Clear throughout to auscultation.   Heart:  Regular rate and rhythm Abdomen:  Soft, nontender and nondistended.   Neuro/Psych:  Alert and cooperative. Normal mood and affect. A and O x 3  Harlin Rain, MD [facility]

## 2023-03-19 NOTE — Op Note (Signed)
[facility] Patient Name: Evelyn Juarez Procedure Date: 03/19/2023 9:03 AM MRN: [id number] Endoscopist: Viviann SpareSteven P. Adela LankArmbruster , MD, [phone] Age: [age] Referring MD:  Date of Birth: 08/21/1966 Gender: Female Account #: [id number] Procedure:                Colonoscopy Indications:              Screening for colorectal malignant neoplasm, This                            is the patient's first colonoscopy Medicines:                Monitored Anesthesia Care Procedure:                Pre-Anesthesia Assessment:                           - Prior to the procedure, a History and Physical                            was performed, and patient medications and                            allergies were reviewed. The patient's tolerance of                            previous anesthesia was also reviewed. The risks                            and benefits of the procedure and the sedation                            options and risks were discussed with the patient.                            All questions were answered, and informed consent                            was obtained. Prior Anticoagulants: The patient has                            taken no anticoagulant or antiplatelet agents. ASA                            Grade Assessment: II - A patient with mild systemic                            disease. After reviewing the risks and benefits,                            the patient was deemed in satisfactory condition to                            undergo the procedure.  After obtaining informed consent, the colonoscope                            was passed under direct vision. Throughout the                            procedure, the patient's blood pressure, pulse, and                            oxygen saturations were monitored continuously. The                            Olympus PCF-H190DL [id number]) Colonoscope was                            introduced through the  anus and advanced to the the                            cecum, identified by appendiceal orifice and                            ileocecal valve. The colonoscopy was performed                            without difficulty. The patient tolerated the                            procedure well. The quality of the bowel                            preparation was good. The ileocecal valve,                            appendiceal orifice, and rectum were photographed. Scope In: 9:06:42 AM Scope Out: 9:24:10 AM Scope Withdrawal Time: 0 hours 14 minutes 39 seconds  Total Procedure Duration: 0 hours 17 minutes 28 seconds  Findings:                 Skin tags were found on perianal exam.                           The terminal ileum appeared normal.                           Two sessile polyps were found in the ascending                            colon. The polyps were 2 to 3 mm in size. These                            polyps were removed with a cold snare. Resection                            and retrieval were complete.  A 7 to 8 mm polyp was found in the hepatic flexure.                            The polyp was sessile. The polyp was removed with a                            cold snare. Resection and retrieval were complete.                           Internal hemorrhoids were found during retroflexion.                           The exam was otherwise without abnormality. Complications:            No immediate complications. Estimated blood loss:                            Minimal. Estimated Blood Loss:     Estimated blood loss was minimal. Impression:               - Perianal skin tags found on perianal exam.                           - The examined portion of the ileum was normal.                           - Two 2 to 3 mm polyps in the ascending colon,                            removed with a cold snare. Resected and retrieved.                           - One 7 to 8 mm  polyp at the hepatic flexure,                            removed with a cold snare. Resected and retrieved.                           - Internal hemorrhoids.                           - The examination was otherwise normal.                           - The GI Genius (intelligent endoscopy module),                            computer-aided polyp detection system powered by AI                            was utilized to detect colorectal polyps through                            enhanced visualization during  colonoscopy. Recommendation:           - Patient has a contact number available for                            emergencies. The signs and symptoms of potential                            delayed complications were discussed with the                            patient. Return to normal activities tomorrow.                            Written discharge instructions were provided to the                            patient.                           - Resume previous diet.                           - Continue present medications.                           - Await pathology results. Viviann Spare P. Adela Lank, MD 03/19/2023 9:28:44 AM This report has been signed electronically.

## 2023-03-19 NOTE — Patient Instructions (Addendum)
Continue present medications. Await pathology results.  Please read over handouts about polyps and hemorrhoids   YOU HAD AN ENDOSCOPIC PROCEDURE TODAY AT THE [facility]:   Refer to the procedure report that was given to you for any specific questions about what was found during the examination.  If the procedure report does not answer your questions, please call your gastroenterologist to clarify.  If you requested that your care partner not be given the details of your procedure findings, then the procedure report has been included in a sealed envelope for you to review at your convenience later.  YOU SHOULD EXPECT: Some feelings of bloating in the abdomen. Passage of more gas than usual.  Walking can help get rid of the air that was put into your GI tract during the procedure and reduce the bloating. If you had a lower endoscopy (such as a colonoscopy or flexible sigmoidoscopy) you may notice spotting of blood in your stool or on the toilet paper. If you underwent a bowel prep for your procedure, you may not have a normal bowel movement for a few days.  Please Note:  You might notice some irritation and congestion in your nose or some drainage.  This is from the oxygen used during your procedure.  There is no need for concern and it should clear up in a day or so.  SYMPTOMS TO REPORT IMMEDIATELY:  Following lower endoscopy (colonoscopy or flexible sigmoidoscopy):  Excessive amounts of blood in the stool  Significant tenderness or worsening of abdominal pains  Swelling of the abdomen that is new, acute  Fever of 100F or higher  For urgent or emergent issues, a gastroenterologist can be reached at any hour by calling [phone]. Do not use MyChart messaging for urgent concerns.    DIET:  We do recommend a small meal at first, but then you may proceed to your regular diet.  Drink plenty of fluids but you should avoid alcoholic beverages for 24 hours.  ACTIVITY:  You should  plan to take it easy for the rest of today and you should NOT DRIVE or use heavy machinery until tomorrow (because of the sedation medicines used during the test).    FOLLOW UP: Our staff will call the number listed on your records the next business day following your procedure.  We will call around 7:15- 8:00 am to check on you and address any questions or concerns that you may have regarding the information given to you following your procedure. If we do not reach you, we will leave a message.     If any biopsies were taken you will be contacted by phone or by letter within the next 1-3 weeks.  Please call us at [phone] if you have not heard about the biopsies in 3 weeks.    SIGNATURES/CONFIDENTIALITY: You and/or your care partner have signed paperwork which will be entered into your electronic medical record.  These signatures attest to the fact that that the information above on your After Visit Summary has been reviewed and is understood.  Full responsibility of the confidentiality of this discharge information lies with you and/or your care-partner.

## 2023-03-19 NOTE — Progress Notes (Signed)
Pt's states no medical or surgical changes since previsit or office visit. 

## 2023-03-19 NOTE — Progress Notes (Signed)
Called to room to assist during endoscopic procedure.  Patient ID and intended procedure confirmed with present staff. Received instructions for my participation in the procedure from the performing physician.  

## 2023-03-19 NOTE — Progress Notes (Signed)
Vss nad trans to pacu 

## 2023-03-20 ENCOUNTER — Telehealth: Payer: Self-pay

## 2023-03-20 NOTE — Telephone Encounter (Signed)
  Follow up Call-     03/19/2023    8:22 AM  Call back number  Post procedure Call Back phone  # [phone]  Permission to leave phone message Yes     Patient questions:  Do you have a fever, pain , or abdominal swelling? No. Pain Score  0 *  Have you tolerated food without any problems? Yes.    Have you been able to return to your normal activities? Yes.    Do you have any questions about your discharge instructions: Diet   No. Medications  No. Follow up visit  No.  Do you have questions or concerns about your Care? No.  Actions: * If pain score is 4 or above: No action needed, pain <4.

## 2023-03-26 ENCOUNTER — Encounter: Payer: Self-pay | Admitting: Gastroenterology

## 2023-11-20 ENCOUNTER — Encounter: Payer: Self-pay | Admitting: Gastroenterology
